# Patient Record
Sex: Female | Born: 1965 | Race: White | Hispanic: No | Marital: Married | Smoking: Never smoker
Health system: Southern US, Community
[De-identification: ages and names within clinical notes are randomized; demographics above are authoritative.]

## PROBLEM LIST (undated history)

## (undated) DIAGNOSIS — D369 Benign neoplasm, unspecified site: Secondary | ICD-10-CM

## (undated) DIAGNOSIS — K621 Rectal polyp: Secondary | ICD-10-CM

## (undated) DIAGNOSIS — K645 Perianal venous thrombosis: Secondary | ICD-10-CM

## (undated) HISTORY — PX: OTHER SURGICAL HISTORY: SHX169

---

## 1997-10-01 ENCOUNTER — Ambulatory Visit (HOSPITAL_COMMUNITY): Admission: RE | Admit: 1997-10-01 | Discharge: 1997-10-01 | Payer: Self-pay | Admitting: Obstetrics and Gynecology

## 1997-10-03 ENCOUNTER — Inpatient Hospital Stay (HOSPITAL_COMMUNITY): Admission: AD | Admit: 1997-10-03 | Discharge: 1997-10-06 | Payer: Self-pay | Admitting: Obstetrics and Gynecology

## 1998-11-21 ENCOUNTER — Other Ambulatory Visit: Admission: RE | Admit: 1998-11-21 | Discharge: 1998-11-21 | Payer: Self-pay | Admitting: Obstetrics and Gynecology

## 2000-04-06 ENCOUNTER — Other Ambulatory Visit: Admission: RE | Admit: 2000-04-06 | Discharge: 2000-04-06 | Payer: Self-pay | Admitting: Obstetrics and Gynecology

## 2001-05-03 ENCOUNTER — Inpatient Hospital Stay (HOSPITAL_COMMUNITY): Admission: AD | Admit: 2001-05-03 | Discharge: 2001-05-05 | Payer: Self-pay | Admitting: *Deleted

## 2001-06-08 ENCOUNTER — Other Ambulatory Visit: Admission: RE | Admit: 2001-06-08 | Discharge: 2001-06-08 | Payer: Self-pay | Admitting: Obstetrics and Gynecology

## 2002-07-27 ENCOUNTER — Other Ambulatory Visit: Admission: RE | Admit: 2002-07-27 | Discharge: 2002-07-27 | Payer: Self-pay | Admitting: Obstetrics and Gynecology

## 2003-08-23 ENCOUNTER — Other Ambulatory Visit: Admission: RE | Admit: 2003-08-23 | Discharge: 2003-08-23 | Payer: Self-pay | Admitting: Obstetrics and Gynecology

## 2004-09-24 ENCOUNTER — Other Ambulatory Visit: Admission: RE | Admit: 2004-09-24 | Discharge: 2004-09-24 | Payer: Self-pay | Admitting: Obstetrics and Gynecology

## 2005-10-13 ENCOUNTER — Other Ambulatory Visit: Admission: RE | Admit: 2005-10-13 | Discharge: 2005-10-13 | Payer: Self-pay | Admitting: Obstetrics and Gynecology

## 2008-06-04 ENCOUNTER — Encounter: Admission: RE | Admit: 2008-06-04 | Discharge: 2008-06-04 | Payer: Self-pay | Admitting: Obstetrics and Gynecology

## 2009-12-14 IMAGING — US UNKNOWN US STUDY
1 series · 4 of 4 positions shown · non-contrast
Comparison: No previous exams.

CLINICAL DATA: Patient presents for additional views of the left
breast as a follow up to recent screening mammogram of 05/21/2008
demonstrating ill-defined density in the slightly outer left
retroareolar region.

DIGITAL DIAGNOSTIC  BILATERAL  MAMMOGRAM  WITH CAD AND LEFT BREAST
ULTRASOUND:

[Series 1: unknown us study · 4 of 4 slices shown]
[im 1/4]
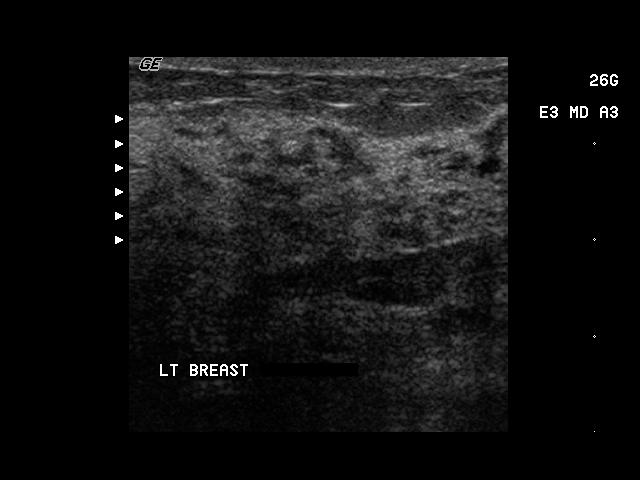
[im 2/4]
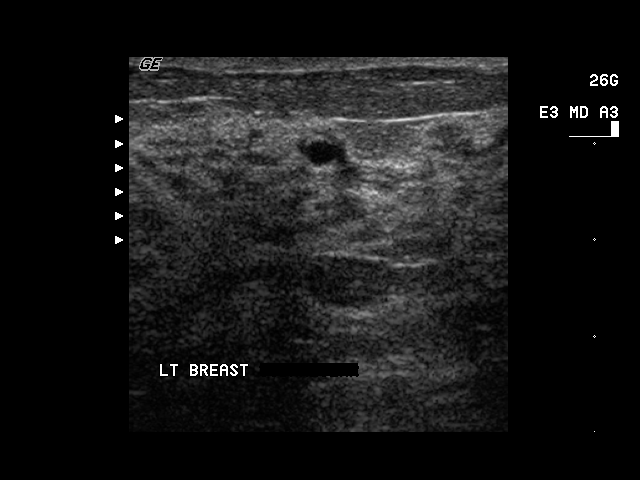
[im 3/4]
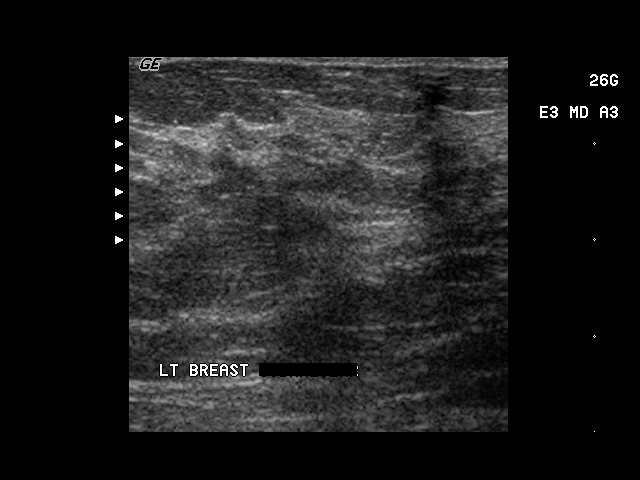
[im 4/4]
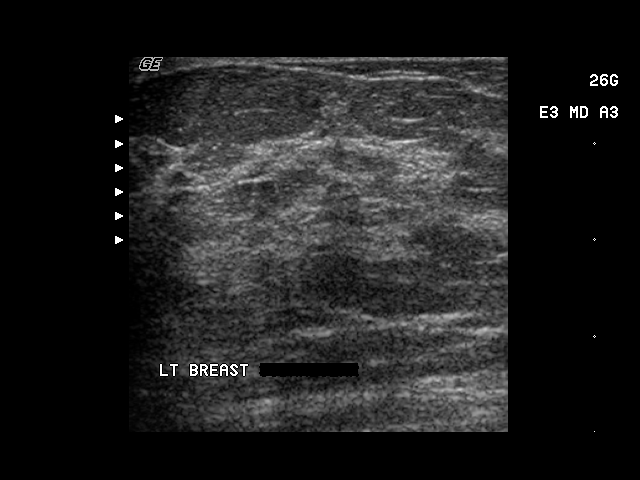

[4 of 4 positions shown; findings below may reference images not displayed]

FINDINGS: Exam demonstrates heterogeneous dense fibroglandular
tissue. Spot compression images again demonstrate the ill-defined
area of increased density in the left retroareolar region but no
well defined focal mass.

Ultrasound is performed, showing no focal abnormality in the left
retroareolar region to to account for the mammographic density as
this likely represents overlapping fibroglandular tissue.  A couple
tiny incidental cysts were noted in the retroareolar region.
IMPRESSION: No focal abnormality in the left retroareolar region.

Recommendations:  Recommend continued annual screening mammographic
followup.

BI-RADS CATEGORY 1:  Negative.

## 2015-11-11 ENCOUNTER — Other Ambulatory Visit: Payer: Self-pay | Admitting: Gastroenterology

## 2015-11-12 ENCOUNTER — Other Ambulatory Visit: Payer: Self-pay | Admitting: Gastroenterology

## 2015-11-12 NOTE — Progress Notes (Signed)
Voice message instructions left with contact [phone]- concerns or questions.

## 2015-11-13 ENCOUNTER — Ambulatory Visit (HOSPITAL_COMMUNITY): Payer: BLUE CROSS/BLUE SHIELD | Admitting: Anesthesiology

## 2015-11-13 ENCOUNTER — Ambulatory Visit (HOSPITAL_COMMUNITY)
Admission: RE | Admit: 2015-11-13 | Discharge: 2015-11-13 | Disposition: A | Discharge status: Home / Self Care | Payer: BLUE CROSS/BLUE SHIELD | Source: Ambulatory Visit | Attending: Gastroenterology | Admitting: Gastroenterology

## 2015-11-13 ENCOUNTER — Encounter (HOSPITAL_COMMUNITY): Payer: Self-pay

## 2015-11-13 ENCOUNTER — Encounter (HOSPITAL_COMMUNITY)
Admission: RE | Disposition: A | Discharge status: Home / Self Care | Payer: Self-pay | Source: Ambulatory Visit | Attending: Gastroenterology

## 2015-11-13 DIAGNOSIS — D128 Benign neoplasm of rectum: Secondary | ICD-10-CM | POA: Insufficient documentation

## 2015-11-13 DIAGNOSIS — Z1211 Encounter for screening for malignant neoplasm of colon: Secondary | ICD-10-CM | POA: Diagnosis present

## 2015-11-13 DIAGNOSIS — Z86010 Personal history of colonic polyps: Secondary | ICD-10-CM | POA: Diagnosis not present

## 2015-11-13 HISTORY — PX: COLONOSCOPY WITH PROPOFOL: SHX5780

## 2015-11-13 SURGERY — COLONOSCOPY WITH PROPOFOL
Anesthesia: Monitor Anesthesia Care

## 2015-11-13 MED ORDER — LACTATED RINGERS IV SOLN: INTRAVENOUS | Status: DC

## 2015-11-13 MED ORDER — PROPOFOL 10 MG/ML IV BOLUS
INTRAVENOUS | Status: AC
  Filled 2015-11-13: qty 20

## 2015-11-13 MED ORDER — FENTANYL CITRATE (PF) 100 MCG/2ML IJ SOLN: 25.0000 ug | INTRAMUSCULAR | Status: DC | PRN

## 2015-11-13 MED ORDER — LACTATED RINGERS IV SOLN
INTRAVENOUS | Status: DC | PRN
  Administered 2015-11-13: 09:00:00 via INTRAVENOUS

## 2015-11-13 MED ORDER — LIDOCAINE HCL (CARDIAC) 20 MG/ML IV SOLN
INTRAVENOUS | Status: AC
  Filled 2015-11-13: qty 5

## 2015-11-13 MED ORDER — SODIUM CHLORIDE 0.9 % IV SOLN: INTRAVENOUS | Status: DC

## 2015-11-13 MED ORDER — LACTATED RINGERS IV SOLN
INTRAVENOUS | Status: DC
  Administered 2015-11-13: 1000 mL via INTRAVENOUS

## 2015-11-13 MED ORDER — MEPERIDINE HCL 100 MG/ML IJ SOLN: 6.2500 mg | INTRAMUSCULAR | Status: DC | PRN

## 2015-11-13 MED ORDER — PROPOFOL 10 MG/ML IV BOLUS
INTRAVENOUS | Status: DC | PRN
  Administered 2015-11-13: 30 mg via INTRAVENOUS
  Administered 2015-11-13: 50 mg via INTRAVENOUS
  Administered 2015-11-13: 20 mg via INTRAVENOUS

## 2015-11-13 MED ORDER — LIDOCAINE HCL (CARDIAC) 20 MG/ML IV SOLN
INTRAVENOUS | Status: DC | PRN
  Administered 2015-11-13 (×2): 50 mg via INTRAVENOUS

## 2015-11-13 MED ORDER — PROPOFOL 10 MG/ML IV BOLUS
INTRAVENOUS | Status: AC
  Filled 2015-11-13: qty 40

## 2015-11-13 MED ORDER — PROPOFOL 500 MG/50ML IV EMUL
INTRAVENOUS | Status: DC | PRN
  Administered 2015-11-13: 125 ug/kg/min via INTRAVENOUS

## 2015-11-13 MED ORDER — METOCLOPRAMIDE HCL 5 MG/ML IJ SOLN: 10.0000 mg | Freq: Once | INTRAMUSCULAR | Status: DC | PRN

## 2015-11-13 SURGICAL SUPPLY — 21 items

## 2015-11-13 NOTE — Anesthesia Preprocedure Evaluation (Addendum)
Anesthesia Evaluation  Patient identified by MRN, date of birth, ID band Patient awake    Reviewed: Allergy & Precautions, NPO status , Patient's Chart, lab work & pertinent test results  Airway Mallampati: II  TM Distance: >3 FB Neck ROM: Full    Dental no notable dental hx.    Pulmonary neg pulmonary ROS,    Pulmonary exam normal breath sounds clear to auscultation       Cardiovascular negative cardio ROS Normal cardiovascular exam Rhythm:Regular Rate:Normal     Neuro/Psych negative neurological ROS  negative psych ROS   GI/Hepatic negative GI ROS, Neg liver ROS,   Endo/Other  negative endocrine ROS  Renal/GU negative Renal ROS  negative genitourinary   Musculoskeletal negative musculoskeletal ROS (+)   Abdominal   Peds negative pediatric ROS (+)  Hematology negative hematology ROS (+)   Anesthesia Other Findings   Reproductive/Obstetrics negative OB ROS                             Anesthesia Physical Anesthesia Plan  ASA: I  Anesthesia Plan: MAC   Post-op Pain Management:    Induction: Intravenous  Airway Management Planned: Simple Face Mask  Additional Equipment:   Intra-op Plan:   Post-operative Plan: Extubation in OR  Informed Consent: I have reviewed the patients History and Physical, chart, labs and discussed the procedure including the risks, benefits and alternatives for the proposed anesthesia with the patient or authorized representative who has indicated his/her understanding and acceptance.   Dental advisory given  Plan Discussed with: CRNA  Anesthesia Plan Comments:        Anesthesia Quick Evaluation

## 2015-11-13 NOTE — Transfer of Care (Signed)
Immediate Anesthesia Transfer of Care Note  Patient: Joanne Ballard  Procedure(s) Performed: Procedure(s): COLONOSCOPY WITH PROPOFOL (N/A)  Patient Location: PACU  Anesthesia Type:MAC  Level of Consciousness: awake, alert  and oriented  Airway & Oxygen Therapy: Patient Spontanous Breathing and Patient connected to face mask oxygen  Post-op Assessment: Report given to RN and Post -op Vital signs reviewed and stable  Post vital signs: Reviewed and stable  Last Vitals:  Filed Vitals:   11/13/15 0902  BP: 112/59  Pulse: 63  Temp: 36.7 C  Resp: 16    Last Pain: There were no vitals filed for this visit.       Complications: No apparent anesthesia complications

## 2015-11-13 NOTE — Op Note (Signed)
[facility] Patient Name: Joanne Ballard Procedure Date: 11/13/2015 MRN: [id number] Attending MD: Arta Silence , MD Date of Birth: [birth year] CSN: [id number] Age: 50 Admit Type: Outpatient Procedure:                Colonoscopy Indications:              High risk colon cancer surveillance: Personal                            history of colonic polyps, High risk colon cancer                            surveillance: Personal history of adenoma (10 mm or                            greater in size), High risk colon cancer                            surveillance: Personal history of adenoma with                            villous component, High risk colon cancer                            surveillance: Personal history of sessile serrated                            colon polyp (less than 10 mm in size) with no                            dysplasia Providers:                Arta Silence, MD, Dustin Flock, RN, Alfonso Patten,                            Technician, [facility], CRNA Referring MD:             Arvella Nigh, MD Medicines:                Propofol per Anesthesia Complications:            No immediate complications. Estimated Blood Loss:     Estimated blood loss was minimal. Procedure:                Pre-Anesthesia Assessment:                           - Prior to the procedure, a History and Physical                            was performed, and patient medications and                            allergies were reviewed. The patient's tolerance of  previous anesthesia was also reviewed. The risks                            and benefits of the procedure and the sedation                            options and risks were discussed with the patient.                            All questions were answered, and informed consent                            was obtained. Prior Anticoagulants: The patient has                            taken no  previous anticoagulant or antiplatelet                            agents. ASA Grade Assessment: I - A normal, healthy                            patient. After reviewing the risks and benefits,                            the patient was deemed in satisfactory condition to                            undergo the procedure.                           After obtaining informed consent, the colonoscope                            was passed under direct vision. Throughout the                            procedure, the patient's blood pressure, pulse, and                            oxygen saturations were monitored continuously. The                            Colonoscope was introduced through the anus and                            advanced to the the cecum, identified by                            appendiceal orifice and ileocecal valve. The                            ileocecal valve, appendiceal orifice, and rectum  were photographed. The entire colon was examined.                            The colonoscopy was performed without difficulty.                            The patient tolerated the procedure well. The                            quality of the bowel preparation was good. Scope In: 9:24:10 AM Scope Out: 9:51:38 AM Scope Withdrawal Time: 0 hours 21 minutes 34 seconds  Total Procedure Duration: 0 hours 27 minutes 28 seconds  Findings:      The perianal and digital rectal examinations were normal.      A 8 mm region of residual polyp was found in the rectum, at location of       prior large rectal polypectomy site. The polyp was sessile. The polyp       was removed with a hot snare. Resection and retrieval were complete.       Small bloody oozing was noted after polypectomy. For hemostasis, two       hemostatic clips were successfully placed. There was no bleeding at the       end of the procedure.      Colon otherwise normal; no other polyps, masses, vascular  ectasias, or       inflammatory changes were seen. Region in ascending colon, with       "nodularity" previously noted, was evaluated and no obvious polyp tissue       was identified.      No additional abnormalities were found on retroflexion. Impression:               - One 8 mm polyp in the rectum, removed with a hot                            snare. Resected and retrieved. Clips were placed.                           - The examination was otherwise normal. Moderate Sedation:      None Recommendation:           - Patient has a contact number available for                            emergencies. The signs and symptoms of potential                            delayed complications were discussed with the                            patient. Return to normal activities tomorrow.                            Written discharge instructions were provided to the                            patient.                           -  Discharge patient to home (via wheelchair).                           - Resume previous diet today.                           - No aspirin, ibuprofen, naproxen, or other                            non-steroidal anti-inflammatory drugs for 5 days                            after polyp removal.                           - Await pathology results.                           - Repeat colonoscopy at appointment to be scheduled                            for surveillance based on pathology results. Given                            residual polyp tissue despite prior endoscopic                            appearance in September 2016 of complete                            polypectomy, might repeat sigmoidoscopy in another                            6 months to reassess rectal polypectomy site again.                           - Return to GI office PRN.                           - Return to referring physician as previously                            scheduled. Procedure Code(s):         --- Professional ---                           [phone], Colonoscopy, flexible; with removal of                            tumor(s), polyp(s), or other lesion(s) by snare                            technique Diagnosis Code(s):        --- Professional ---  K62.1, Rectal polyp                           Z86.010, Personal history of colonic polyps CPT copyright 2016 [employer]. All rights reserved. The codes documented in this report are preliminary and upon coder review may  be revised to meet current compliance requirements. Arta Silence, MD 11/13/2015 10:04:26 AM This report has been signed electronically. Number of Addenda: 0

## 2015-11-13 NOTE — Discharge Instructions (Signed)
Colonoscopy, Care After Refer to this sheet in the next few weeks. These instructions provide you with information on caring for yourself after your procedure. Your health care provider may also give you more specific instructions. Your treatment has been planned according to current medical practices, but problems sometimes occur. Call your health care provider if you have any problems or questions after your procedure. WHAT TO EXPECT AFTER THE PROCEDURE  After your procedure, it is typical to have the following:  A small amount of blood in your stool.  Moderate amounts of gas and mild abdominal cramping or bloating. HOME CARE INSTRUCTIONS  Do not drive, operate machinery, or sign important documents for 24 hours.  You may shower and resume your regular physical activities, but move at a slower pace for the first 24 hours.  Take frequent rest periods for the first 24 hours.  Walk around or put a warm pack on your abdomen to help reduce abdominal cramping and bloating.  Drink enough fluids to keep your urine clear or pale yellow.  You may resume your normal diet as instructed by your health care provider. Avoid heavy or fried foods that are hard to digest.  Avoid drinking alcohol for 24 hours or as instructed by your health care provider.  Only take over-the-counter or prescription medicines as directed by your health care provider.  If a tissue sample (biopsy) was taken during your procedure:  Do not take aspirin, antiinflammatory medications (NSAIDs), or blood thinners for 7 days, or as instructed by your health care provider.  Do not drink alcohol for 7 days, or as instructed by your health care provider.  Eat soft foods for the first 24 hours. SEEK MEDICAL CARE IF: You have persistent spotting of blood in your stool 2-3 days after the procedure. SEEK IMMEDIATE MEDICAL CARE IF:  You have more than a small spotting of blood in your stool.  You pass large blood clots in your  stool.  Your abdomen is swollen (distended).  You have nausea or vomiting.  You have a fever.  You have increasing abdominal pain that is not relieved with medicine.   This information is not intended to replace advice given to you by your health care provider. Make sure you discuss any questions you have with your health care provider.   Document Released: 02/04/2004 Document Revised: 04/12/2013 Document Reviewed: 02/27/2013 Elsevier Interactive Patient Education [employer].

## 2015-11-13 NOTE — Anesthesia Postprocedure Evaluation (Signed)
Anesthesia Post Note  Patient: Joanne Ballard  Procedure(s) Performed: Procedure(s) (LRB): COLONOSCOPY WITH PROPOFOL (N/A)  Patient location during evaluation: Endoscopy Anesthesia Type: MAC Level of consciousness: awake and alert Pain management: pain level controlled Vital Signs Assessment: post-procedure vital signs reviewed and stable Respiratory status: spontaneous breathing, nonlabored ventilation, respiratory function stable and patient connected to nasal cannula oxygen Cardiovascular status: stable and blood pressure returned to baseline Anesthetic complications: no    Last Vitals:  Filed Vitals:   11/13/15 1020 11/13/15 1026  BP: 114/65 121/76  Pulse: 57 57  Temp:    Resp: 15 14    Last Pain: There were no vitals filed for this visit.               Montez Hageman

## 2015-11-13 NOTE — H&P (Signed)
Patient interval history reviewed.  Patient examined again.  There has been no change from documented H/P dated 10/28/15 (scanned into chart from our office) except as documented above.  Assessment:  1.  Personal history multiple large colon polyps.  Plan:  1.  Colonoscopy with possible polypectomy, possible biopsy, possible APC therapy. 2.  Risks (bleeding, infection, bowel perforation that could require surgery, sedation-related changes in cardiopulmonary systems), benefits (identification and possible treatment of source of symptoms, exclusion of certain causes of symptoms), and alternatives (watchful waiting, radiographic imaging studies, empiric medical treatment) of colonoscopy were explained to patient/family in detail and patient wishes to proceed.

## 2015-11-14 ENCOUNTER — Encounter (HOSPITAL_COMMUNITY): Payer: Self-pay | Admitting: Gastroenterology

## 2016-07-27 ENCOUNTER — Ambulatory Visit: Payer: Self-pay | Admitting: Surgery

## 2016-07-27 NOTE — H&P (Signed)
Joanne Ballard 07/27/2016 11:18 AM Location: Joanne [facility]ard Patient #: Joanne Ballard DOB: 1965-09-27 Married / Language: Joanne Ballard / Race: White Female  History of Present Illness Joanne Hector MD; 07/27/2016 12:00 PM) The patient is a 51 year old female who presents with a colorectal polyp. Note for "Colorectal polyp": Patient sent for surgical consultation at the request of her gastroenterologist, Dr. well Ballard. 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  Pleasant active female. Colon polyps. Had adenomatous ones removed. Has been one in the distal rectum that has been removed from it keeps recurring. Probably was a bulky 1 and 2016. Had colonoscopy in May and residual polyp removed consistent with adenomatous polyp. Can scoped in January. Again small polyp noted. He is adenomatous. Request for more definitive removal.  Condition usually moves her bowels once a day. His had some mild hemorrhoid flares once or twice year but nothing too severe. No major bleeding. Great-grandmother died of colon cancer. History of polyps. She had a C-section in 1999 but no other surgeries. She comes today with her husband. Smoking. As a few drinks alcohol week total. No personal nor family history of inflammatory bowel disease, irritable bowel syndrome, allergy such as Celiac Sprue, dietary/dairy problems, colitis, ulcers nor gastritis. No recent sick contacts/gastroenteritis. No travel outside the country. No changes in diet. No dysphagia to solids or liquids. No significant heartburn or reflux. No hematochezia, hematemesis, coffee ground emesis. No evidence of prior gastric/peptic ulceration.   Past Surgical History Joanne Ballard, [place]; 07/27/2016 11:18 AM) Cesarean Section - 1 Colon Polyp Removal - Colonoscopy  Diagnostic Studies History Joanne Ballard, [place]; 07/27/2016 11:18 AM) Colonoscopy within last year Mammogram within last year Pap Smear 1-5 years  ago  Allergies Joanne Ballard, CMA; 07/27/2016 11:19 AM) Erythrocin Stearate *MACROLIDES* Stomach Cramps Allergies Reconciled  Medication History Joanne Ballard, CMA; 07/27/2016 11:19 AM) No Current Medications Medications Reconciled  Social History Joanne Ballard, [place]; 07/27/2016 11:18 AM) Alcohol use Occasional alcohol use. Caffeine use Carbonated beverages, Tea. No drug use Tobacco use Never smoker.  Family History Joanne Ballard, [place]; 07/27/2016 11:18 AM) Cancer Father. Colon Polyps Mother. Hypertension Father, Mother.  Pregnancy / Birth History Joanne Ballard, [place]; 07/27/2016 11:18 AM) Age at menarche 65 years. Contraceptive History Intrauterine device, Oral contraceptives. Gravida 2 Irregular periods Maternal age 13-30 Para 2  Other Problems Joanne Ballard, [place]; 07/27/2016 11:18 AM) Hemorrhoids     Review of Systems (Joanne Ballard; 07/27/2016 11:18 AM) General Not Present- Appetite Loss, Chills, Fatigue, Fever, Night Sweats, Weight Gain and Weight Loss. Skin Not Present- Change in Wart/Mole, Dryness, Hives, Jaundice, New Lesions, Non-Healing Wounds, Rash and Ulcer. HEENT Present- Wears glasses/contact lenses. Not Present- Earache, Hearing Loss, Hoarseness, Nose Bleed, Oral Ulcers, Ringing in the Ears, Seasonal Allergies, Sinus Pain, Sore Throat, Visual Disturbances and Yellow Eyes. Respiratory Not Present- Bloody sputum, Chronic Cough, Difficulty Breathing, Snoring and Wheezing. Breast Not Present- Breast Mass, Breast Pain, Nipple Discharge and Skin Changes. Cardiovascular Not Present- Chest Pain, Difficulty Breathing Lying Down, Leg Cramps, Palpitations, Rapid Heart Rate, Shortness of Breath and Swelling of Extremities. Gastrointestinal Present- Hemorrhoids. Not Present- Abdominal Pain, Bloating, Bloody Stool, Change in Bowel Habits, Chronic diarrhea, Constipation, Difficulty Swallowing, Excessive gas, Gets full quickly at meals, Indigestion, Nausea,  Rectal Pain and Vomiting. Female Genitourinary Not Present- Frequency, Nocturia, Painful Urination, Pelvic Pain and Urgency. Musculoskeletal Not Present- Back Pain, Joint Pain, Joint Stiffness, Muscle Pain, Muscle Weakness and Swelling of Extremities. Neurological Not Present- Decreased Memory, Fainting, Headaches, Numbness, Seizures, Tingling, Tremor,  Trouble walking and Weakness. Psychiatric Not Present- Anxiety, Bipolar, Change in Sleep Pattern, Depression, Fearful and Frequent crying. Endocrine Present- Hot flashes. Not Present- Cold Intolerance, Excessive Hunger, Hair Changes, Heat Intolerance and New Diabetes. Hematology Not Present- Blood Thinners, Easy Bruising, Excessive bleeding, Gland problems, HIV and Persistent Infections.  Vitals Joanne Ballard CMA; 07/27/2016 11:20 AM) 07/27/2016 11:19 AM Weight: 161.8 lb Height: 68in Body Surface Area: 1.87 m Body Mass Index: 24.6 kg/m  Temp.: 98.17F  Pulse: 100 (Regular)  BP: 120/72 (Sitting, Left Arm, Standard)      Physical Exam Joanne Hector MD; 07/27/2016 11:56 AM)  General Mental Status-Alert. General Appearance-Not in acute distress, Not Sickly. Orientation-Oriented X3. Hydration-Joanne hydrated. Voice-Normal.  Integumentary Global Assessment Upon inspection and palpation of skin surfaces of the - Axillae: non-tender, no inflammation or ulceration, no drainage. and Distribution of scalp and body hair is normal. General Characteristics Temperature - normal warmth is noted.  Head and Neck Head-normocephalic, atraumatic with no lesions or palpable masses. Face Global Assessment - atraumatic, no absence of expression. Neck Global Assessment - no abnormal movements, no bruit auscultated on the right, no bruit auscultated on the left, no decreased range of motion, non-tender. Trachea-midline. Thyroid Gland Characteristics - non-tender.  Eye Eyeball - Left-Extraocular movements intact, No  Nystagmus. Eyeball - Right-Extraocular movements intact, No Nystagmus. Cornea - Left-No Hazy. Cornea - Right-No Hazy. Sclera/Conjunctiva - Left-No scleral icterus, No Discharge. Sclera/Conjunctiva - Right-No scleral icterus, No Discharge. Pupil - Left-Direct reaction to light normal. Pupil - Right-Direct reaction to light normal.  ENMT Ears Pinna - Left - no drainage observed, no generalized tenderness observed. Right - no drainage observed, no generalized tenderness observed. Nose and Sinuses External Inspection of the Nose - no destructive lesion observed. Inspection of the nares - Left - quiet respiration. Right - quiet respiration. Mouth and Throat Lips - Upper Lip - no fissures observed, no pallor noted. Lower Lip - no fissures observed, no pallor noted. Nasopharynx - no discharge present. Oral Cavity/Oropharynx - Tongue - no dryness observed. Oral Mucosa - no cyanosis observed. Hypopharynx - no evidence of airway distress observed.  Chest and Lung Exam Inspection Movements - Normal and Symmetrical. Accessory muscles - No use of accessory muscles in breathing. Palpation Palpation of the chest reveals - Non-tender. Auscultation Breath sounds - Normal and Clear.  Cardiovascular Auscultation Rhythm - Regular. Murmurs & Other Heart Sounds - Auscultation of the heart reveals - No Murmurs and No Systolic Clicks.  Abdomen Inspection Inspection of the abdomen reveals - No Visible peristalsis and No Abnormal pulsations. Umbilicus - No Bleeding, No Urine drainage. Palpation/Percussion Palpation and Percussion of the abdomen reveal - Soft, Non Tender, No Rebound tenderness, No Rigidity (guarding) and No Cutaneous hyperesthesia. Note: Abdomen soft. Nontender, nondistended. No guarding. No diastasis. No umbilical nor other hernias  Female Genitourinary Sexual Maturity Tanner 5 - Adult hair pattern. Note: No vaginal bleeding nor discharge  Rectal Note: Right  posterior mobile 15 x 10 mm elevated scar about 3-4 centimeters from the anal verge. Right posterior internal hemorrhoid partially prolapsing. Rest of the hemorrhoid piles not particularly inflamed. No obvious scarring elsewhere.  Perianal skin clean with good hygiene. No pruritis ani. No pilonidal disease. No fissure. No abscess/fistula. Normal sphincter tone. No external hemorrhoids. No condyloma warts. Tolerates digital and anoscopic rectal exam. No other rectal masses. Exam done with assistance of female PAssistant in the room.  Peripheral Vascular Upper Extremity Inspection - Left - No Cyanotic nailbeds, Not Ischemic. Right - No  Cyanotic nailbeds, Not Ischemic.  Neurologic Neurologic evaluation reveals -normal attention span and ability to concentrate, able to name objects and repeat phrases. Appropriate fund of knowledge , normal sensation and normal coordination. Mental Status Affect - not angry, not paranoid. Cranial Nerves-Normal Bilaterally. Gait-Normal.  Neuropsychiatric Mental status exam performed with findings of-able to articulate Joanne with normal speech/language, rate, volume and coherence, thought content normal with ability to perform basic computations and apply abstract reasoning and no evidence of hallucinations, delusions, obsessions or homicidal/suicidal ideation.  Musculoskeletal Global Assessment Spine, Ribs and Pelvis - no instability, subluxation or laxity. Right Upper Extremity - no instability, subluxation or laxity.  Lymphatic Head & Neck  General Head & Neck Lymphatics: Bilateral - Description - No Localized lymphadenopathy. Axillary  General Axillary Region: Bilateral - Description - No Localized lymphadenopathy. Femoral & Inguinal  Generalized Femoral & Inguinal Lymphatics: Left - Description - No Localized lymphadenopathy. Right - Description - No Localized lymphadenopathy.    Assessment & Plan Joanne Hector MD; 07/27/2016 11:56  AM)  ADENOMATOUS RECTAL POLYP (D12.8) Impression: Recurrent adenomatous rectal polyp despite prior polypectomies. Appears to be right posterior midline about 3 cm from the anal verge.  I think she would benefit from resection. Would do TEM partial proctectomy to get good margins. She has to deal with a partially prolapsing hemorrhoid in the same time. Should be outpatient Ballard since its rather distal and posterior. The patient has been agree to proceed. I believe they have some vacation trip in March, so there hoping to get it done in early February of possible. Could not guarantee scheduling but we will see.  Current Plans You are being scheduled for Ballard- Our schedulers will call you.  You should hear from our office's scheduling department within 5 working days about the location, date, and time of Ballard. We try to make accommodations for patient's preferences in scheduling Ballard, but sometimes the OR schedule or the surgeon's schedule prevents [place] from making those accommodations.  If you have not heard from our office [phone]) in 5 working days, call the office and ask for your surgeon's nurse.  If you have other questions about your diagnosis, plan, or Ballard, call the office and ask for your surgeon's nurse.  Written instructions provided Pt Education - [facility] TEM Education (Anvith Mauriello): discussed with patient and provided information. Consider follow up colonoscopy by your gastroenterologist. Since it was a benign pre-cancerous polyp that was removed by colon resection, consider follow-up colonoscopy in about 3 years. Call your gastroenterologist for advice  PROLAPSED INTERNAL HEMORRHOIDS, GRADE 3 (K64.2) Impression: Right posterior partially prolapsing hemorrhoid. Not horrifically symptomatic. May do hemorrhoidal ligation at that time. Most likely will improve after doing the TEM resection of the right posterior rectal wall mass anyway.  Current Plans Pt Education - [facility]  Hemorrhoids (Janeka Libman): discussed with patient and provided information. ENCOUNTER FOR PREOPERATIVE EXAMINATION FOR GENERAL SURGICAL PROCEDURE (Z01.818)  Current Plans You are being scheduled for Ballard- Our schedulers will call you.  You should hear from our office's scheduling department within 5 working days about the location, date, and time of Ballard. We try to make accommodations for patient's preferences in scheduling Ballard, but sometimes the OR schedule or the surgeon's schedule prevents [place] from making those accommodations.  If you have not heard from our office [phone]) in 5 working days, call the office and ask for your surgeon's nurse.  If you have other questions about your diagnosis, plan, or Ballard, call the office and ask for  your surgeon's nurse.  Pt Education - [facility] Rectal Prep for Anorectal outpatient/office Ballard: discussed with patient and provided information. Pt Education - [facility] Rectal [facility] HCI (Aleyah Balik): discussed with patient and provided information.  Joanne Ballard, M.D., F.A.C.S. [facility] [address], [place], [place] [address] [phone] Main / Paging

## 2016-08-10 NOTE — Patient Instructions (Addendum)
Joanne Ballard  08/10/2016   Your procedure is scheduled on: 08-20-16  Report to [facility] Main  Entrance take [facility]  elevators to 3rd floor to  [place] at 1200 PM  Call this number if you have problems the morning of surgery [phone]   Remember: ONLY 1 PERSON MAY GO WITH YOU TO SHORT STAY TO GET  READY MORNING OF YOUR SURGERY.  Do not eat food s :After Midnight TUESDAY NIGHT FOLLOW ALL BOWEL PREP INSTRUCTIONS FROM DR GROSS AND DRINK PLENTY OF CLEAR LIQUIDS TO PREVENT DEHYDRATION WITH BOWEL PREP. MAY HAVE CLEAR LIQUIDS UNTIL 900 AM DAY OF SURGERY-THEN NOTHING BY MOUTH AFTER 900 AM DAY OF SURGERY.     Take these medicines the morning of surgery with A SIP OF WATER: NONE                               You may not have any metal on your body including hair pins and              piercings  Do not wear jewelry, make-up, lotions, powders or perfumes, deodorant             Do not wear nail polish.  Do not shave  48 hours prior to surgery.              Men may shave face and neck.   Do not bring valuables to the hospital. [place].  Contacts, dentures or bridgework may not be worn into surgery.  Leave suitcase in the car. After surgery it may be brought to your room.     Patients discharged the day of surgery will not be allowed to drive home.  Name and phone number of your driver:brad spouse cell [phone]  Special Instructions: N/A              Please read over the following fact sheets you were given: _____________________________________________________________________                CLEAR LIQUID DIET   Foods Allowed                                                                     Foods Excluded  Coffee and tea, regular and decaf                             liquids that you cannot  Plain Jell-O in any flavor                                             see through such as: Fruit ices  (not with fruit pulp)  milk, soups, orange juice  Iced Popsicles                                    All solid food Carbonated beverages, regular and diet                                    Cranberry, grape and apple juices Sports drinks like Gatorade Lightly seasoned clear broth or consume(fat free) Sugar, honey syrup  Sample Menu Breakfast                                Lunch                                     Supper Cranberry juice                    Beef broth                            Chicken broth Jell-O                                     Grape juice                           Apple juice Coffee or tea                        Jell-O                                      Popsicle                                                Coffee or tea                        Coffee or tea  _____________________________________________________________________  [facility] - Preparing for Surgery Before surgery, you can play an important role.  Because skin is not sterile, your skin needs to be as free of germs as possible.  You can reduce the number of germs on your skin by washing with CHG (chlorahexidine gluconate) soap before surgery.  CHG is an antiseptic cleaner which kills germs and bonds with the skin to continue killing germs even after washing. Please DO NOT use if you have an allergy to CHG or antibacterial soaps.  If your skin becomes reddened/irritated stop using the CHG and inform your nurse when you arrive at Short Stay. Do not shave (including legs and underarms) for at least 48 hours prior to the first CHG shower.  You may shave your face/neck. Please follow these instructions carefully:  1.  Shower with CHG Soap the night before surgery and the  morning of Surgery.  2.  If you choose to wash  your hair, wash your hair first as usual with your  normal  shampoo.  3.  After you shampoo, rinse your hair and body thoroughly to remove the  shampoo.                            4.  Use CHG as you would any other liquid soap.  You can apply chg directly  to the skin and wash                       Gently with a scrungie or clean washcloth.  5.  Apply the CHG Soap to your body ONLY FROM THE NECK DOWN.   Do not use on face/ open                           Wound or open sores. Avoid contact with eyes, ears mouth and genitals (private parts).                       Wash face,  Genitals (private parts) with your normal soap.             6.  Wash thoroughly, paying special attention to the area where your surgery  will be performed.  7.  Thoroughly rinse your body with warm water from the neck down.  8.  DO NOT shower/wash with your normal soap after using and rinsing off  the CHG Soap.                9.  Pat yourself dry with a clean towel.            10.  Wear clean pajamas.            11.  Place clean sheets on your bed the night of your first shower and do not  sleep with pets. Day of Surgery : Do not apply any lotions/deodorants the morning of surgery.  Please wear clean clothes to the hospital/surgery center.  FAILURE TO FOLLOW THESE INSTRUCTIONS MAY RESULT IN THE CANCELLATION OF YOUR SURGERY PATIENT SIGNATURE_________________________________  NURSE SIGNATURE__________________________________  ________________________________________________________________________

## 2016-08-11 ENCOUNTER — Encounter (HOSPITAL_COMMUNITY): Payer: Self-pay | Admitting: *Deleted

## 2016-08-11 ENCOUNTER — Encounter (HOSPITAL_COMMUNITY)
Admission: RE | Admit: 2016-08-11 | Discharge: 2016-08-11 | Disposition: A | Discharge status: Home / Self Care | Payer: BLUE CROSS/BLUE SHIELD | Source: Ambulatory Visit | Attending: Surgery | Admitting: Surgery

## 2016-08-11 DIAGNOSIS — D128 Benign neoplasm of rectum: Secondary | ICD-10-CM | POA: Insufficient documentation

## 2016-08-11 DIAGNOSIS — Z01812 Encounter for preprocedural laboratory examination: Secondary | ICD-10-CM | POA: Insufficient documentation

## 2016-08-11 HISTORY — DX: Benign neoplasm, unspecified site: D36.9

## 2016-08-11 LAB — CBC
HCT: 37.4 % (ref 36.0–46.0)
HEMOGLOBIN: 12.6 g/dL (ref 12.0–15.0)
MCH: 29.2 pg (ref 26.0–34.0)
MCHC: 33.7 g/dL (ref 30.0–36.0)
MCV: 86.8 fL (ref 78.0–100.0)
Platelets: 248 10*3/uL (ref 150–400)
RBC: 4.31 MIL/uL (ref 3.87–5.11)
RDW: 12.8 % (ref 11.5–15.5)
WBC: 4.7 10*3/uL (ref 4.0–10.5)

## 2016-08-11 LAB — HCG, SERUM, QUALITATIVE: PREG SERUM: NEGATIVE

## 2016-08-12 LAB — HEMOGLOBIN A1C
Hgb A1c MFr Bld: 5.1 % (ref 4.8–5.6)
Mean Plasma Glucose: 100 mg/dL

## 2016-08-20 ENCOUNTER — Ambulatory Visit (HOSPITAL_COMMUNITY): Payer: BLUE CROSS/BLUE SHIELD | Admitting: Anesthesiology

## 2016-08-20 ENCOUNTER — Ambulatory Visit (HOSPITAL_COMMUNITY)
Admission: RE | Admit: 2016-08-20 | Discharge: 2016-08-20 | Disposition: A | Discharge status: Home / Self Care | Payer: BLUE CROSS/BLUE SHIELD | Source: Ambulatory Visit | Attending: Surgery | Admitting: Surgery

## 2016-08-20 ENCOUNTER — Encounter (HOSPITAL_COMMUNITY)
Admission: RE | Disposition: A | Discharge status: Home / Self Care | Payer: Self-pay | Source: Ambulatory Visit | Attending: Surgery

## 2016-08-20 ENCOUNTER — Encounter (HOSPITAL_COMMUNITY): Payer: Self-pay | Admitting: *Deleted

## 2016-08-20 DIAGNOSIS — K645 Perianal venous thrombosis: Secondary | ICD-10-CM | POA: Diagnosis not present

## 2016-08-20 DIAGNOSIS — Z86010 Personal history of colonic polyps: Secondary | ICD-10-CM | POA: Insufficient documentation

## 2016-08-20 DIAGNOSIS — D128 Benign neoplasm of rectum: Secondary | ICD-10-CM | POA: Diagnosis present

## 2016-08-20 DIAGNOSIS — Z881 Allergy status to other antibiotic agents status: Secondary | ICD-10-CM | POA: Diagnosis not present

## 2016-08-20 DIAGNOSIS — K621 Rectal polyp: Secondary | ICD-10-CM

## 2016-08-20 DIAGNOSIS — Z800 Family history of malignant neoplasm of digestive organs: Secondary | ICD-10-CM | POA: Diagnosis not present

## 2016-08-20 DIAGNOSIS — K648 Other hemorrhoids: Secondary | ICD-10-CM | POA: Insufficient documentation

## 2016-08-20 HISTORY — DX: Perianal venous thrombosis: K64.5

## 2016-08-20 HISTORY — PX: HEMORRHOID SURGERY: SHX153

## 2016-08-20 HISTORY — PX: PARTIAL PROCTECTOMY BY TEM: SHX6011

## 2016-08-20 HISTORY — DX: Rectal polyp: K62.1

## 2016-08-20 SURGERY — PARTIAL PROCTECTOMY BY TEM
Anesthesia: General | Site: Rectum

## 2016-08-20 MED ORDER — DEXAMETHASONE SODIUM PHOSPHATE 10 MG/ML IJ SOLN
INTRAMUSCULAR | Status: AC
  Filled 2016-08-20: qty 1

## 2016-08-20 MED ORDER — ONDANSETRON HCL 4 MG/2ML IJ SOLN
INTRAMUSCULAR | Status: AC
  Filled 2016-08-20: qty 2

## 2016-08-20 MED ORDER — LIDOCAINE 2% (20 MG/ML) 5 ML SYRINGE
INTRAMUSCULAR | Status: AC
  Filled 2016-08-20: qty 15

## 2016-08-20 MED ORDER — STERILE WATER FOR IRRIGATION IR SOLN
Status: DC | PRN
  Administered 2016-08-20: 1000 mL

## 2016-08-20 MED ORDER — SUGAMMADEX SODIUM 200 MG/2ML IV SOLN
INTRAVENOUS | Status: AC
  Filled 2016-08-20: qty 2

## 2016-08-20 MED ORDER — FENTANYL CITRATE (PF) 100 MCG/2ML IJ SOLN
INTRAMUSCULAR | Status: AC
  Filled 2016-08-20: qty 2

## 2016-08-20 MED ORDER — FENTANYL CITRATE (PF) 100 MCG/2ML IJ SOLN
INTRAMUSCULAR | Status: DC | PRN
  Administered 2016-08-20 (×2): 50 ug via INTRAVENOUS

## 2016-08-20 MED ORDER — PROPOFOL 10 MG/ML IV BOLUS
INTRAVENOUS | Status: DC | PRN
  Administered 2016-08-20: 150 mg via INTRAVENOUS

## 2016-08-20 MED ORDER — DIBUCAINE 1 % RE OINT
TOPICAL_OINTMENT | RECTAL | Status: DC | PRN
  Administered 2016-08-20: 1 via RECTAL

## 2016-08-20 MED ORDER — OXYCODONE HCL 5 MG PO TABS
5.0000 mg | ORAL_TABLET | ORAL | Status: DC | PRN
Start: 2016-08-20 — End: 2016-08-20

## 2016-08-20 MED ORDER — BUPIVACAINE HCL (PF) 0.25 % IJ SOLN
INTRAMUSCULAR | Status: AC
  Filled 2016-08-20: qty 30

## 2016-08-20 MED ORDER — PROPOFOL 10 MG/ML IV BOLUS
INTRAVENOUS | Status: AC
  Filled 2016-08-20: qty 20

## 2016-08-20 MED ORDER — MIDAZOLAM HCL 2 MG/2ML IJ SOLN
INTRAMUSCULAR | Status: AC
  Filled 2016-08-20: qty 2

## 2016-08-20 MED ORDER — LIDOCAINE 2% (20 MG/ML) 5 ML SYRINGE
INTRAMUSCULAR | Status: DC | PRN
  Administered 2016-08-20: 40 mg via INTRAVENOUS

## 2016-08-20 MED ORDER — CELECOXIB 200 MG PO CAPS
400.0000 mg | ORAL_CAPSULE | ORAL | Status: AC
  Administered 2016-08-20: 400 mg via ORAL
  Filled 2016-08-20: qty 2

## 2016-08-20 MED ORDER — DEXAMETHASONE SODIUM PHOSPHATE 10 MG/ML IJ SOLN
INTRAMUSCULAR | Status: DC | PRN
  Administered 2016-08-20: 10 mg via INTRAVENOUS

## 2016-08-20 MED ORDER — 0.9 % SODIUM CHLORIDE (POUR BTL) OPTIME
TOPICAL | Status: DC | PRN
  Administered 2016-08-20: 1000 mL

## 2016-08-20 MED ORDER — ROCURONIUM BROMIDE 50 MG/5ML IV SOSY
PREFILLED_SYRINGE | INTRAVENOUS | Status: AC
  Filled 2016-08-20: qty 5

## 2016-08-20 MED ORDER — LIDOCAINE 2% (20 MG/ML) 5 ML SYRINGE
INTRAMUSCULAR | Status: DC | PRN
  Administered 2016-08-20: 1.5 mg/kg/h via INTRAVENOUS

## 2016-08-20 MED ORDER — ROCURONIUM BROMIDE 50 MG/5ML IV SOSY
PREFILLED_SYRINGE | INTRAVENOUS | Status: DC | PRN
  Administered 2016-08-20: 40 mg via INTRAVENOUS
  Administered 2016-08-20: 10 mg via INTRAVENOUS

## 2016-08-20 MED ORDER — GABAPENTIN 300 MG PO CAPS
300.0000 mg | ORAL_CAPSULE | ORAL | Status: AC
  Administered 2016-08-20: 300 mg via ORAL
  Filled 2016-08-20: qty 1

## 2016-08-20 MED ORDER — ACETAMINOPHEN 500 MG PO TABS
1000.0000 mg | ORAL_TABLET | ORAL | Status: AC
  Administered 2016-08-20: 1000 mg via ORAL
  Filled 2016-08-20: qty 2

## 2016-08-20 MED ORDER — LACTATED RINGERS IV SOLN
INTRAVENOUS | Status: DC
  Administered 2016-08-20: 13:00:00 via INTRAVENOUS

## 2016-08-20 MED ORDER — SODIUM CHLORIDE 0.9 % IJ SOLN
INTRAMUSCULAR | Status: AC
Start: 2016-08-20 — End: ?
  Filled 2016-08-20: qty 50

## 2016-08-20 MED ORDER — LACTATED RINGERS IR SOLN
Status: DC | PRN
  Administered 2016-08-20: 1000 mL

## 2016-08-20 MED ORDER — DIBUCAINE 1 % RE OINT
TOPICAL_OINTMENT | RECTAL | Status: AC
  Filled 2016-08-20: qty 28

## 2016-08-20 MED ORDER — BUPIVACAINE LIPOSOME 1.3 % IJ SUSP
INTRAMUSCULAR | Status: DC | PRN
  Administered 2016-08-20: 20 mL

## 2016-08-20 MED ORDER — HYDROMORPHONE HCL 1 MG/ML IJ SOLN
0.2500 mg | INTRAMUSCULAR | Status: DC | PRN
  Administered 2016-08-20 (×2): 0.5 mg via INTRAVENOUS

## 2016-08-20 MED ORDER — HYDROMORPHONE HCL 1 MG/ML IJ SOLN
INTRAMUSCULAR | Status: AC
  Filled 2016-08-20: qty 1

## 2016-08-20 MED ORDER — KETAMINE HCL 10 MG/ML IJ SOLN
INTRAMUSCULAR | Status: DC | PRN
  Administered 2016-08-20 (×4): 10 mg via INTRAVENOUS

## 2016-08-20 MED ORDER — BUPIVACAINE LIPOSOME 1.3 % IJ SUSP
20.0000 mL | INTRAMUSCULAR | Status: DC
  Filled 2016-08-20: qty 20

## 2016-08-20 MED ORDER — SUGAMMADEX SODIUM 200 MG/2ML IV SOLN
INTRAVENOUS | Status: DC | PRN
  Administered 2016-08-20: 200 mg via INTRAVENOUS

## 2016-08-20 MED ORDER — ONDANSETRON HCL 4 MG/2ML IJ SOLN
INTRAMUSCULAR | Status: DC | PRN
  Administered 2016-08-20: 4 mg via INTRAVENOUS

## 2016-08-20 MED ORDER — BUPIVACAINE HCL (PF) 0.25 % IJ SOLN
INTRAMUSCULAR | Status: DC | PRN
  Administered 2016-08-20: 20 mL

## 2016-08-20 MED ORDER — DEXTROSE 5 % IV SOLN
2.0000 g | INTRAVENOUS | Status: AC
  Administered 2016-08-20: 2 g via INTRAVENOUS
  Filled 2016-08-20: qty 2

## 2016-08-20 MED ORDER — MIDAZOLAM HCL 2 MG/2ML IJ SOLN
INTRAMUSCULAR | Status: DC | PRN
  Administered 2016-08-20: 2 mg via INTRAVENOUS

## 2016-08-20 MED ORDER — OXYCODONE HCL 5 MG PO TABS: 5.0000 mg | ORAL_TABLET | ORAL | 0 refills | Status: AC | PRN

## 2016-08-20 MED ORDER — OXYCODONE HCL 5 MG PO TABS: 10.0000 mg | ORAL_TABLET | ORAL | Status: DC | PRN

## 2016-08-20 MED ORDER — SUCCINYLCHOLINE CHLORIDE 200 MG/10ML IV SOSY
PREFILLED_SYRINGE | INTRAVENOUS | Status: AC
  Filled 2016-08-20: qty 10

## 2016-08-20 MED ORDER — CEFOTETAN DISODIUM-DEXTROSE 2-2.08 GM-% IV SOLR
INTRAVENOUS | Status: AC
  Filled 2016-08-20: qty 50

## 2016-08-20 SURGICAL SUPPLY — 51 items
BLADE SURG 15 STRL LF DISP TIS (BLADE) IMPLANT
BLADE SURG 15 STRL SS (BLADE)
BRIEF STRETCH FOR OB PAD LRG (UNDERPADS AND DIAPERS) ×5 IMPLANT
CABLE HIGH FREQUENCY MONO STRZ (ELECTRODE) ×5 IMPLANT
COVER SURGICAL LIGHT HANDLE (MISCELLANEOUS) ×5 IMPLANT
DRAPE LAPAROTOMY T 102X78X121 (DRAPES) IMPLANT
DRAPE WARM FLUID 44X44 (DRAPE) ×5 IMPLANT
DRSG PAD ABDOMINAL 8X10 ST (GAUZE/BANDAGES/DRESSINGS) ×5 IMPLANT
ELECT PENCIL ROCKER SW 15FT (MISCELLANEOUS) ×5 IMPLANT
ELECT REM PT RETURN 9FT ADLT (ELECTROSURGICAL) ×5
ELECTRODE REM PT RTRN 9FT ADLT (ELECTROSURGICAL) ×3 IMPLANT
GAUZE SPONGE 4X4 12PLY STRL (GAUZE/BANDAGES/DRESSINGS) ×5 IMPLANT
GAUZE SPONGE 4X4 16PLY XRAY LF (GAUZE/BANDAGES/DRESSINGS) ×5 IMPLANT
GLOVE ECLIPSE 8.0 STRL XLNG CF (GLOVE) ×10 IMPLANT
GLOVE INDICATOR 8.0 STRL GRN (GLOVE) ×10 IMPLANT
GOWN STRL REUS W/TWL XL LVL3 (GOWN DISPOSABLE) ×15 IMPLANT
KIT BASIN OR (CUSTOM PROCEDURE TRAY) ×5 IMPLANT
LEGGING LITHOTOMY PAIR STRL (DRAPES) ×5 IMPLANT
LUBRICANT JELLY K Y 4OZ (MISCELLANEOUS) ×5 IMPLANT
NEEDLE HYPO 22GX1.5 SAFETY (NEEDLE) ×5 IMPLANT
PACK BASIC VI WITH GOWN DISP (CUSTOM PROCEDURE TRAY) ×5 IMPLANT
PAD POSITIONING PINK XL (MISCELLANEOUS) ×5 IMPLANT
RETRACTOR LONE STAR DISPOSABLE (INSTRUMENTS) IMPLANT
RETRACTOR STAY HOOK 5MM (MISCELLANEOUS) IMPLANT
SCISSORS LAP 5X35 DISP (ENDOMECHANICALS) ×5 IMPLANT
SET IRRIG TUBING LAPAROSCOPIC (IRRIGATION / IRRIGATOR) ×10 IMPLANT
SHEARS HARMONIC ACE PLUS 36CM (ENDOMECHANICALS) ×5 IMPLANT
STOPCOCK 4 WAY LG BORE MALE ST (IV SETS) IMPLANT
SUT CHROMIC 2 0 SH (SUTURE) ×5 IMPLANT
SUT CHROMIC 3 0 SH 27 (SUTURE) IMPLANT
SUT PDS AB 2-0 CT2 27 (SUTURE) IMPLANT
SUT PDS AB 3-0 SH 27 (SUTURE) IMPLANT
SUT SILK 2 0 (SUTURE)
SUT SILK 2 0 SH CR/8 (SUTURE) IMPLANT
SUT SILK 2-0 18XBRD TIE 12 (SUTURE) IMPLANT
SUT SILK 3 0 SH 30 (SUTURE) IMPLANT
SUT SILK 3 0 SH CR/8 (SUTURE) IMPLANT
SUT V-LOC BARB 180 2/0GR6 GS22 (SUTURE)
SUT VIC AB 2-0 UR6 27 (SUTURE) ×15 IMPLANT
SUT VIC AB 3-0 SH 27 (SUTURE)
SUT VIC AB 3-0 SH 27XBRD (SUTURE) IMPLANT
SUTURE V-LC BRB 180 2/0GR6GS22 (SUTURE) IMPLANT
SYR 20CC LL (SYRINGE) ×5 IMPLANT
SYR BULB IRRIGATION 50ML (SYRINGE) IMPLANT
TOWEL OR 17X26 10 PK STRL BLUE (TOWEL DISPOSABLE) ×5 IMPLANT
TOWEL OR NON WOVEN STRL DISP B (DISPOSABLE) ×5 IMPLANT
TRAY FOLEY W/METER SILVER 16FR (SET/KITS/TRAYS/PACK) ×5 IMPLANT
TUBING CONNECTING 10 (TUBING) IMPLANT
TUBING CONNECTING 10' (TUBING)
TUBING INSUF HEATED (TUBING) ×5 IMPLANT
YANKAUER SUCT BULB TIP 10FT TU (MISCELLANEOUS) IMPLANT

## 2016-08-20 NOTE — Progress Notes (Signed)
Patient desires to get up to bathroom to void. Unable to void. Changed mesh briefs, abdominal pad, and 4x4 gauze as it is soiled with frank red blood. Patient tolerated ambulating well.

## 2016-08-20 NOTE — Anesthesia Preprocedure Evaluation (Addendum)
Anesthesia Evaluation  Patient identified by MRN, date of birth, ID band Patient awake    Reviewed: Allergy & Precautions, H&P , NPO status , Patient's Chart, lab work & pertinent test results  Airway Mallampati: II  TM Distance: >3 FB Neck ROM: Full    Dental no notable dental hx. (+) Teeth Intact, Dental Advisory Given   Pulmonary neg pulmonary ROS,    Pulmonary exam normal breath sounds clear to auscultation       Cardiovascular negative cardio ROS   Rhythm:Regular Rate:Normal     Neuro/Psych negative neurological ROS  negative psych ROS   GI/Hepatic negative GI ROS, Neg liver ROS,   Endo/Other  negative endocrine ROS  Renal/GU negative Renal ROS  negative genitourinary   Musculoskeletal   Abdominal   Peds  Hematology negative hematology ROS (+)   Anesthesia Other Findings   Reproductive/Obstetrics negative OB ROS                            Anesthesia Physical Anesthesia Plan  ASA: I  Anesthesia Plan: General   Post-op Pain Management:    Induction: Intravenous  Airway Management Planned: Oral ETT  Additional Equipment:   Intra-op Plan:   Post-operative Plan: Extubation in OR  Informed Consent: I have reviewed the patients History and Physical, chart, labs and discussed the procedure including the risks, benefits and alternatives for the proposed anesthesia with the patient or authorized representative who has indicated his/her understanding and acceptance.   Dental advisory given  Plan Discussed with: CRNA  Anesthesia Plan Comments:         Anesthesia Quick Evaluation

## 2016-08-20 NOTE — Interval H&P Note (Signed)
History and Physical Interval Note:  08/20/2016 1:20 PM  Joanne Ballard  has presented today for surgery, with the diagnosis of Recurrent rectal adenomatous polyp  The various methods of treatment have been discussed with the patient and family. After consideration of risks, benefits and other options for treatment, the patient has consented to  Procedure(s): PARTIAL PROCTECTOMY BY TEM WITH ERAS PATHWAY (N/A) EXAM UNDER ANESTHESIA (N/A) as a surgical intervention .  The patient's history has been reviewed, patient examined, no change in status, stable for surgery.  I have reviewed the patient's chart and labs.  Questions were answered to the patient's satisfaction.     [name]

## 2016-08-20 NOTE — Transfer of Care (Signed)
Immediate Anesthesia Transfer of Care Note  Patient: Joanne Ballard  Procedure(s) Performed: Procedure(s): PARTIAL PROCTECTOMY OF RECURRENT RECTAL POLYP BY TEM WITH ERAS PATHWAY (N/A) EXAM UNDER ANESTHESIA (N/A) HEMORRHOIDECTOMY  Patient Location: PACU  Anesthesia Type:General  Level of Consciousness: awake, alert , oriented and patient cooperative  Airway & Oxygen Therapy: Patient Spontanous Breathing and Patient connected to face mask oxygen  Post-op Assessment: Report given to RN and Post -op Vital signs reviewed and stable  Post vital signs: Reviewed and stable  Last Vitals:  Vitals:   08/20/16 1202  BP: 119/75  Pulse: 89  Resp: 16  Temp: 36.7 C    Last Pain:  Vitals:   08/20/16 1220  TempSrc:   PainSc: 0-No pain      Patients Stated Pain Goal: 3 ([id number])  Complications: No apparent anesthesia complications

## 2016-08-20 NOTE — Anesthesia Procedure Notes (Signed)
Procedure Name: Intubation Date/Time: 08/20/2016 1:50 PM Performed by: Dione Booze Pre-anesthesia Checklist: Patient identified, Emergency Drugs available, Suction available and Patient being monitored Patient Re-evaluated:Patient Re-evaluated prior to inductionOxygen Delivery Method: Circle system utilized Preoxygenation: Pre-oxygenation with 100% oxygen Intubation Type: IV induction Ventilation: Mask ventilation without difficulty Laryngoscope Size: Mac and 4 Grade View: Grade I Tube type: Oral Tube size: 7.5 mm Number of attempts: 1 Airway Equipment and Method: Stylet Placement Confirmation: ETT inserted through vocal cords under direct vision,  positive ETCO2 and breath sounds checked- equal and bilateral Secured at: 21 cm Tube secured with: Tape Dental Injury: Teeth and Oropharynx as per pre-operative assessment

## 2016-08-20 NOTE — Op Note (Signed)
08/20/2016  3:12 PM  PATIENT:  Joanne Ballard  51 y.o. female  Patient Care Team: Arvella Nigh, MD as PCP - General (Obstetrics and Gynecology) Michael Boston, MD as Consulting Physician (General Surgery) Arta Silence, MD as Consulting Physician (Gastroenterology)  PRE-OPERATIVE DIAGNOSIS:  Recurrent rectal adenomatous polyp  POST-OPERATIVE DIAGNOSIS:    Recurrent rectal adenomatous polyp Left posterior hemorrhoidectomy  PROCEDURE:    PARTIAL PROCTECTOMY OF RECURRENT RECTAL POLYP BY TEM  EXAM UNDER ANESTHESIA HEMORRHOIDECTOMY x1  SURGEON:  [name], MD  ASSISTANT: RN   ANESTHESIA:   local and general WITH ERAS PATHWAY  EBL:  Total I/O In: 700 [I.V.:700] Out: 100 [Urine:50; Blood:50]  Delay start of Pharmacological VTE agent (>24hrs) due to surgical blood loss or risk of bleeding:  no  DRAINS: none   SPECIMEN:  Source of Specimen:  Rectal polyp (see below) & left posterior internal hemorrhoid  DISPOSITION OF SPECIMEN:  PATHOLOGY  COUNTS:  YES  PLAN OF CARE: Admit for overnight observation  PATIENT DISPOSITION:  PACU - hemodynamically stable.  INDICATION: Pleasant wounds were recurrent polyps.  Has a recurrent distal rectal polyp despite polypectomies.  Concern for more invasive component.  Surgical request made for removal.  Patient also with some hemorrhoids as well.  Offered partial proctectomy of the recurrent rectal mass and possible hemorrhoidal ligation/removal.  The anatomy & physiology of the digestive tract was discussed.  The pathophysiology of the rectal pathology was discussed.  Natural history risks without surgery was discussed.   I feel the risks of no intervention will lead to serious problems that outweigh the operative risks; therefore, I recommended surgery.    Laparoscopic & open abdominal techniques were discussed.  I recommended we start with a partial proctectomy by transanal endoscopic microsurgery (TEM) for excisional biopsy to remove  the pathology and hopefully cure and/or control the pathology.  This technique can offer less operative risk and faster post-operative recovery.  Possible need for immediate or later abdominal surgery for further treatment was discussed.   Risks such as bleeding, abscess, reoperation, ostomy, heart attack, death, and other risks were discussed.   I noted a good likelihood this will help address the problem.  Goals of post-operative recovery were discussed as well.  We will work to minimize complications.  An educational handout was given as well.  Questions were answered.  The patient expresses understanding & wishes to proceed with surgery.  OR FINDINGS: Firm 15 mm pearly polypoid mobile mass on the posterior midline rectal wall about 3.5 cm proximal to the anal verge.  The resulting mass was 1.5 cm (Specimen 3.5x3.5 cm in size total)  Pin placement on pathology specimen: Proximal margin: purple Distal margin: blue Right lateral margin: white Left lateral margin: yellow  The closure rests 1 cm from the anal verge in the posterior location. Partially prolapsing left posteriolateral partially thrombosed hemorrhoid.  Excised and closed  DESCRIPTION: Informed consent was confirmed.  Patient received general anesthesia without difficulty.  Foley catheter sterilely placed.  Sequential compression devices active during the entire case.  The patient was placed in the lithotomy position, taking extra care to secure and protect the patient appropriately.  The perineum and perianal regions were prepped and draped in a sterile fashion.  Surgical timeout confirmed our plan.  I did a gentle digital rectal examination with gradual anal dilation to allow placement of the 4 cm TEO Stortz scope.  This was secured to the bed using the TEO clamping system.  We induced  carbon dioxide insufflation intraluminally.  I oriented the Stortz scope and the patient such that the specimen rested towards the floor at the 6:00  position.  I could easily identify the mass.  I went ahead and used tip point cautery to mark 1 cm margins circumferentially.  I then did a full thickness transection at the distal margin.  I came around laterally.  Switched over to harmonic dissection.  Lifted the specimen off the proximal sphincter complex and posterior pelvic canal for a good deep margin.  I gradually came more proximally.  Transected at the proximal margin.  I ensured hemostasis.  I inspected the main specimen and pinned it on thick cork board.  Pins as noted above.  I walked the specimen down to pathology and showed the [facility] pathology team for proper orientation.    I went back in and scrubbed in.  Hemostasis excellent.  I reapproximated the wound with a 2-0 vicryl horizontal mattress stitch to bring the middle part of the rectum down to help close the middle of the wound.  I then closed the wound transversely using 2-0 Vicryl stitch in a running fashion from each corner.  This brought things together well.  I did meticulous inspection with fine tip instruments to confirm good watertight closure   Hemostasis excellent.  The lumen was quite patent.    Patient still had a partially thrombosed partially prolapsed left posterior lateral hemorrhoid.  When an excised it primarily sparing anoderm and removing some clotted hemorrhoidal vessels.  A closed that wound with a running 2-0 chromic suture, leaving the last few millimeters open to for drainage.  And inspection.  No stenosis.  No bleeding.    The patient has been extubated & is in recovery room.  I discussed operative findings, updated the patient's status, discussed probable steps to recovery, and gave postoperative recommendations to the patient's spouse.  Recommendations were made.  Questions were answered.  He expressed understanding & appreciation.   [name], M.D., F.A.C.S. Gastrointestinal and Minimally Invasive Surgery [facility] [address] [place], [place] [address] [phone] Main / Paging

## 2016-08-20 NOTE — H&P (View-Only) (Signed)
[name] 07/27/2016 11:18 AM Location: [facility] Patient #: [name] DOB: 12-02-1965 Married / Language: Joanne Ballard / Race: White Female  History of Present Illness Joanne Hector MD; 07/27/2016 12:00 PM) The patient is a 51 year old female who presents with a colorectal polyp. Note for "Colorectal polyp": Patient sent for surgical consultation at the request of her gastroenterologist, Dr. well outlaw. 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  Pleasant active female. Colon polyps. Had adenomatous ones removed. Has been one in the distal rectum that has been removed from it keeps recurring. Probably was a bulky 1 and 2016. Had colonoscopy in May and residual polyp removed consistent with adenomatous polyp. Can scoped in January. Again small polyp noted. He is adenomatous. Request for more definitive removal.  Condition usually moves her bowels once a day. His had some mild hemorrhoid flares once or twice year but nothing too severe. No major bleeding. Great-grandmother died of colon cancer. History of polyps. She had a C-section in 1999 but no other surgeries. She comes today with her husband. Smoking. As a few drinks alcohol week total. No personal nor family history of inflammatory bowel disease, irritable bowel syndrome, allergy such as Celiac Sprue, dietary/dairy problems, colitis, ulcers nor gastritis. No recent sick contacts/gastroenteritis. No travel outside the country. No changes in diet. No dysphagia to solids or liquids. No significant heartburn or reflux. No hematochezia, hematemesis, coffee ground emesis. No evidence of prior gastric/peptic ulceration.   Past Surgical History Joanne Ballard, [place]; 07/27/2016 11:18 AM) Cesarean Section - 1 Colon Polyp Removal - Colonoscopy  Diagnostic Studies History Joanne Ballard, [place]; 07/27/2016 11:18 AM) Colonoscopy within last year Mammogram within last year Pap Smear 1-5 years  ago  Allergies Joanne Ballard, CMA; 07/27/2016 11:19 AM) Erythrocin Stearate *MACROLIDES* Stomach Cramps Allergies Reconciled  Medication History Joanne Ballard, CMA; 07/27/2016 11:19 AM) No Current Medications Medications Reconciled  Social History Joanne Ballard, [place]; 07/27/2016 11:18 AM) Alcohol use Occasional alcohol use. Caffeine use Carbonated beverages, Tea. No drug use Tobacco use Never smoker.  Family History Joanne Ballard, [place]; 07/27/2016 11:18 AM) Cancer Father. Colon Polyps Mother. Hypertension Father, Mother.  Pregnancy / Birth History Joanne Ballard, [place]; 07/27/2016 11:18 AM) Age at menarche 26 years. Contraceptive History Intrauterine device, Oral contraceptives. Gravida 2 Irregular periods Maternal age 24-30 Para 2  Other Problems Joanne Ballard, [place]; 07/27/2016 11:18 AM) Hemorrhoids     Review of Systems ([name]; 07/27/2016 11:18 AM) General Not Present- Appetite Loss, Chills, Fatigue, Fever, Night Sweats, Weight Gain and Weight Loss. Skin Not Present- Change in Wart/Mole, Dryness, Hives, Jaundice, New Lesions, Non-Healing Wounds, Rash and Ulcer. HEENT Present- Wears glasses/contact lenses. Not Present- Earache, Hearing Loss, Hoarseness, Nose Bleed, Oral Ulcers, Ringing in the Ears, Seasonal Allergies, Sinus Pain, Sore Throat, Visual Disturbances and Yellow Eyes. Respiratory Not Present- Bloody sputum, Chronic Cough, Difficulty Breathing, Snoring and Wheezing. Breast Not Present- Breast Mass, Breast Pain, Nipple Discharge and Skin Changes. Cardiovascular Not Present- Chest Pain, Difficulty Breathing Lying Down, Leg Cramps, Palpitations, Rapid Heart Rate, Shortness of Breath and Swelling of Extremities. Gastrointestinal Present- Hemorrhoids. Not Present- Abdominal Pain, Bloating, Bloody Stool, Change in Bowel Habits, Chronic diarrhea, Constipation, Difficulty Swallowing, Excessive gas, Gets full quickly at meals, Indigestion, Nausea,  Rectal Pain and Vomiting. Female Genitourinary Not Present- Frequency, Nocturia, Painful Urination, Pelvic Pain and Urgency. Musculoskeletal Not Present- Back Pain, Joint Pain, Joint Stiffness, Muscle Pain, Muscle Weakness and Swelling of Extremities. Neurological Not Present- Decreased Memory, Fainting, Headaches, Numbness, Seizures, Tingling, Tremor,  Trouble walking and Weakness. Psychiatric Not Present- Anxiety, Bipolar, Change in Sleep Pattern, Depression, Fearful and Frequent crying. Endocrine Present- Hot flashes. Not Present- Cold Intolerance, Excessive Hunger, Hair Changes, Heat Intolerance and New Diabetes. Hematology Not Present- Blood Thinners, Easy Bruising, Excessive bleeding, Gland problems, HIV and Persistent Infections.  Vitals Bary Castilla Joanne Ballard CMA; 07/27/2016 11:20 AM) 07/27/2016 11:19 AM Weight: 161.8 lb Height: 68in Body Surface Area: 1.87 m Body Mass Index: 24.6 kg/m  Temp.: 98.24F  Pulse: 100 (Regular)  BP: 120/72 (Sitting, Left Arm, Standard)      Physical Exam Joanne Hector MD; 07/27/2016 11:56 AM)  General Mental Status-Alert. General Appearance-Not in acute distress, Not Sickly. Orientation-Oriented X3. Hydration-Well hydrated. Voice-Normal.  Integumentary Global Assessment Upon inspection and palpation of skin surfaces of the - Axillae: non-tender, no inflammation or ulceration, no drainage. and Distribution of scalp and body hair is normal. General Characteristics Temperature - normal warmth is noted.  Head and Neck Head-normocephalic, atraumatic with no lesions or palpable masses. Face Global Assessment - atraumatic, no absence of expression. Neck Global Assessment - no abnormal movements, no bruit auscultated on the right, no bruit auscultated on the left, no decreased range of motion, non-tender. Trachea-midline. Thyroid Gland Characteristics - non-tender.  Eye Eyeball - Left-Extraocular movements intact, No  Nystagmus. Eyeball - Right-Extraocular movements intact, No Nystagmus. Cornea - Left-No Hazy. Cornea - Right-No Hazy. Sclera/Conjunctiva - Left-No scleral icterus, No Discharge. Sclera/Conjunctiva - Right-No scleral icterus, No Discharge. Pupil - Left-Direct reaction to light normal. Pupil - Right-Direct reaction to light normal.  ENMT Ears Pinna - Left - no drainage observed, no generalized tenderness observed. Right - no drainage observed, no generalized tenderness observed. Nose and Sinuses External Inspection of the Nose - no destructive lesion observed. Inspection of the nares - Left - quiet respiration. Right - quiet respiration. Mouth and Throat Lips - Upper Lip - no fissures observed, no pallor noted. Lower Lip - no fissures observed, no pallor noted. Nasopharynx - no discharge present. Oral Cavity/Oropharynx - Tongue - no dryness observed. Oral Mucosa - no cyanosis observed. Hypopharynx - no evidence of airway distress observed.  Chest and Lung Exam Inspection Movements - Normal and Symmetrical. Accessory muscles - No use of accessory muscles in breathing. Palpation Palpation of the chest reveals - Non-tender. Auscultation Breath sounds - Normal and Clear.  Cardiovascular Auscultation Rhythm - Regular. Murmurs & Other Heart Sounds - Auscultation of the heart reveals - No Murmurs and No Systolic Clicks.  Abdomen Inspection Inspection of the abdomen reveals - No Visible peristalsis and No Abnormal pulsations. Umbilicus - No Bleeding, No Urine drainage. Palpation/Percussion Palpation and Percussion of the abdomen reveal - Soft, Non Tender, No Rebound tenderness, No Rigidity (guarding) and No Cutaneous hyperesthesia. Note: Abdomen soft. Nontender, nondistended. No guarding. No diastasis. No umbilical nor other hernias  Female Genitourinary Sexual Maturity Tanner 5 - Adult hair pattern. Note: No vaginal bleeding nor discharge  Rectal Note: Right  posterior mobile 15 x 10 mm elevated scar about 3-4 centimeters from the anal verge. Right posterior internal hemorrhoid partially prolapsing. Rest of the hemorrhoid piles not particularly inflamed. No obvious scarring elsewhere.  Perianal skin clean with good hygiene. No pruritis ani. No pilonidal disease. No fissure. No abscess/fistula. Normal sphincter tone. No external hemorrhoids. No condyloma warts. Tolerates digital and anoscopic rectal exam. No other rectal masses. Exam done with assistance of female PAssistant in the room.  Peripheral Vascular Upper Extremity Inspection - Left - No Cyanotic nailbeds, Not Ischemic. Right - No  Cyanotic nailbeds, Not Ischemic.  Neurologic Neurologic evaluation reveals -normal attention span and ability to concentrate, able to name objects and repeat phrases. Appropriate fund of knowledge , normal sensation and normal coordination. Mental Status Affect - not angry, not paranoid. Cranial Nerves-Normal Bilaterally. Gait-Normal.  Neuropsychiatric Mental status exam performed with findings of-able to articulate well with normal speech/language, rate, volume and coherence, thought content normal with ability to perform basic computations and apply abstract reasoning and no evidence of hallucinations, delusions, obsessions or homicidal/suicidal ideation.  Musculoskeletal Global Assessment Spine, Ribs and Pelvis - no instability, subluxation or laxity. Right Upper Extremity - no instability, subluxation or laxity.  Lymphatic Head & Neck  General Head & Neck Lymphatics: Bilateral - Description - No Localized lymphadenopathy. Axillary  General Axillary Region: Bilateral - Description - No Localized lymphadenopathy. Femoral & Inguinal  Generalized Femoral & Inguinal Lymphatics: Left - Description - No Localized lymphadenopathy. Right - Description - No Localized lymphadenopathy.    Assessment & Plan Joanne Hector MD; 07/27/2016 11:56  AM)  ADENOMATOUS RECTAL POLYP (D12.8) Impression: Recurrent adenomatous rectal polyp despite prior polypectomies. Appears to be right posterior midline about 3 cm from the anal verge.  I think she would benefit from resection. Would do TEM partial proctectomy to get good margins. She has to deal with a partially prolapsing hemorrhoid in the same time. Should be outpatient surgery since its rather distal and posterior. The patient has been agree to proceed. I believe they have some vacation trip in March, so there hoping to get it done in early February of possible. Could not guarantee scheduling but we will see.  Current Plans You are being scheduled for surgery- Our schedulers will call you.  You should hear from our office's scheduling department within 5 working days about the location, date, and time of surgery. We try to make accommodations for patient's preferences in scheduling surgery, but sometimes the OR schedule or the surgeon's schedule prevents [place] from making those accommodations.  If you have not heard from our office [phone] in 5 working days, call the office and ask for your surgeon's nurse.  If you have other questions about your diagnosis, plan, or surgery, call the office and ask for your surgeon's nurse.  Written instructions provided Pt Education - [facility] TEM Education (Shanon Seawright): discussed with patient and provided information. Consider follow up colonoscopy by your gastroenterologist. Since it was a benign pre-cancerous polyp that was removed by colon resection, consider follow-up colonoscopy in about 3 years. Call your gastroenterologist for advice  PROLAPSED INTERNAL HEMORRHOIDS, GRADE 3 (K64.2) Impression: Right posterior partially prolapsing hemorrhoid. Not horrifically symptomatic. May do hemorrhoidal ligation at that time. Most likely will improve after doing the TEM resection of the right posterior rectal wall mass anyway.  Current Plans Pt Education - [facility]  Hemorrhoids (Caeson Filippi): discussed with patient and provided information. ENCOUNTER FOR PREOPERATIVE EXAMINATION FOR GENERAL SURGICAL PROCEDURE (Z01.818)  Current Plans You are being scheduled for surgery- Our schedulers will call you.  You should hear from our office's scheduling department within 5 working days about the location, date, and time of surgery. We try to make accommodations for patient's preferences in scheduling surgery, but sometimes the OR schedule or the surgeon's schedule prevents [place] from making those accommodations.  If you have not heard from our office [phone]) in 5 working days, call the office and ask for your surgeon's nurse.  If you have other questions about your diagnosis, plan, or surgery, call the office and ask for  your surgeon's nurse.  Pt Education - [facility] Rectal Prep for Anorectal outpatient/office surgery: discussed with patient and provided information. Pt Education - [facility] Rectal Surgery HCI (Natelie Ostrosky): discussed with patient and provided information.  Joanne Ballard, M.D., F.A.C.S. Gastrointestinal and Minimally Invasive Surgery [facility] [address] [place], [place] [address] [phone] Main / Paging

## 2016-08-20 NOTE — Discharge Instructions (Signed)
General Anesthesia, Adult, Care After These instructions provide you with information about caring for yourself after your procedure. Your health care provider may also give you more specific instructions. Your treatment has been planned according to current medical practices, but problems sometimes occur. Call your health care provider if you have any problems or questions after your procedure. What can I expect after the procedure? After the procedure, it is common to have:  Vomiting.  A sore throat.  Mental slowness. It is common to feel:  Nauseous.  Cold or shivery.  Sleepy.  Tired.  Sore or achy, even in parts of your body where you did not have surgery. Follow these instructions at home: For at least 24 hours after the procedure:  Do not:  Participate in activities where you could fall or become injured.  Drive.  Use heavy machinery.  Drink alcohol.  Take sleeping pills or medicines that cause drowsiness.  Make important decisions or sign legal documents.  Take care of children on your own.  Rest. Eating and drinking  If you vomit, drink water, juice, or soup when you can drink without vomiting.  Drink enough fluid to keep your urine clear or pale yellow.  Make sure you have little or no nausea before eating solid foods.  Follow the diet recommended by your health care provider. General instructions  Have a responsible adult stay with you until you are awake and alert.  Return to your normal activities as told by your health care provider. Ask your health care provider what activities are safe for you.  Take over-the-counter and prescription medicines only as told by your health care provider.  If you smoke, do not smoke without supervision.  Keep all follow-up visits as told by your health care provider. This is important. Contact a health care provider if:  You continue to have nausea or vomiting at home, and medicines are not helpful.  You  cannot drink fluids or start eating again.  You cannot urinate after 8-12 hours.  You develop a skin rash.  You have fever.  You have increasing redness at the site of your procedure. Get help right away if:  You have difficulty breathing.  You have chest pain.  You have unexpected bleeding.  You feel that you are having a life-threatening or urgent problem. This information is not intended to replace advice given to you by your health care provider. Make sure you discuss any questions you have with your health care provider. Document Released: 09/28/2000 Document Revised: 11/25/2015 Document Reviewed: 06/06/2015 Elsevier Interactive Patient Education  2017 [place]:  POST OPERATIVE INSTRUCTIONS  ######################################################################  EAT Gradually transition to a high fiber diet with a fiber supplement over the next few weeks after discharge.  Start with a pureed / full liquid diet (see below)  WALK Walk an hour a day.  Control your pain to do that.    CONTROL PAIN Control pain so that you can walk, sleep, tolerate sneezing/coughing, go up/down stairs.  HAVE A BOWEL MOVEMENT DAILY Keep your bowels regular to avoid problems.  OK to try a laxative to override constipation.  OK to use an antidairrheal to slow down diarrhea.  Call if not better after 2 tries  CALL IF YOU HAVE PROBLEMS/CONCERNS Call if you are still struggling despite following these instructions. Call if you have concerns not answered by these instructions  ######################################################################    1. Take your usually prescribed home medications unless otherwise directed. 2.  DIET: Follow a light bland diet the first 24 hours after arrival home, such as soup, liquids, crackers, etc.  Be sure to include lots of fluids daily.  Avoid fast food or heavy meals as your are more likely to get nauseated.  Eat a low fat  the next few days after surgery.   3. PAIN CONTROL: a. Pain is best controlled by a usual combination of three different methods TOGETHER: i. Ice/Heat ii. Over the counter pain medication iii. Prescription pain medication b. Most patients will experience some swelling and discomfort in the anus/rectal area. and incisions.  Ice packs or heat (30-60 minutes up to 6 times a day) will help. Use ice for the first few days to help decrease swelling and bruising, then switch to heat such as warm towels, sitz baths, warm baths, etc to help relax tight/sore spots and speed recovery.  Some people prefer to use ice alone, heat alone, alternating between ice & heat.  Experiment to what works for you.  Swelling and bruising can take several weeks to resolve.   c. It is helpful to take an over-the-counter pain medication regularly for the first few weeks.  Choose one of the following that works best for you: i. Naproxen (Aleve, etc)  Two 220mg  tabs twice a day ii. Ibuprofen (Advil, etc) Three 200mg  tabs four times a day (every meal & bedtime) iii. Acetaminophen (Tylenol, etc) 500-650mg  four times a day (every meal & bedtime) d. A  prescription for pain medication (such as oxycodone, hydrocodone, etc) should be given to you upon discharge.  Take your pain medication as prescribed.  i. If you are having problems/concerns with the prescription medicine (does not control pain, nausea, vomiting, rash, itching, etc), please call us [phone] to see if we need to switch you to a different pain medicine that will work better for you and/or control your side effect better. ii. If you need a refill on your pain medication, please contact your pharmacy.  They will contact our office to request authorization. Prescriptions will not be filled after 5 pm or on week-ends.  Use a Sitz Bath 4-8 times a day for relief   [employer] A sitz bath is a warm water bath taken in the sitting position that covers only the hips and  buttocks. It may be used for either healing or hygiene purposes. Sitz baths are also used to relieve pain, itching, or muscle spasms. The water may contain medicine. Moist heat will help you heal and relax.  HOME CARE INSTRUCTIONS  Take 3 to 4 sitz baths a day. 1. Fill the bathtub half full with warm water. 2. Sit in the water and open the drain a little. 3. Turn on the warm water to keep the tub half full. Keep the water running constantly. 4. Soak in the water for 15 to 20 minutes. 5. After the sitz bath, pat the affected area dry first.   4. KEEP YOUR BOWELS REGULAR a. The goal is one bowel movement a day b. Avoid getting constipated.  Between the surgery and the pain medications, it is common to experience some constipation.  Increasing fluid intake and taking a fiber supplement (such as Metamucil, Citrucel, FiberCon, MiraLax, etc) 1-2 times a day regularly will usually help prevent this problem from occurring.  A mild laxative (prune juice, Milk of Magnesia, MiraLax, etc) should be taken according to package directions if there are no bowel movements after 48 hours. c. Watch out for diarrhea.  If  you have many loose bowel movements, simplify your diet to bland foods & liquids for a few days.  Stop any stool softeners and decrease your fiber supplement.  Switching to mild anti-diarrheal medications (Kayopectate, Pepto Bismol) can help.  If this worsens or does not improve, please call us.  5. Wound Care  a. Remove your bandages the day after surgery.  Unless discharge instructions indicate otherwise, leave your bandage dry and in place overnight.  Remove the bandage during your first bowel movement.   b. Wear an absorbent pad or soft cotton gauze in your underwear as needed to catch any drainage and help keep the area  c. Keep the area clean and dry.  Bathe / shower every day.  Keep the area clean by showering / bathing over the incision / wound.   It is okay to soak an open wound to help wash  it.  Wet wipes or showers / gentle washing after bowel movements is often less traumatic than regular toilet paper. d. Dennis Bast will often notice bleeding with bowel movements.  This should slow down by the end of the first week of surgery e. Expect some drainage.  This should slow down, too, by the end of the first week of surgery.  Wear an absorbent pad or soft cotton gauze in your underwear until the drainage stops.  6. ACTIVITIES as tolerated:   a. You may resume regular (light) daily activities beginning the next day--such as daily self-care, walking, climbing stairs--gradually increasing activities as tolerated.  If you can walk 30 minutes without difficulty, it is safe to try more intense activity such as jogging, treadmill, bicycling, low-impact aerobics, swimming, etc. b. Save the most intensive and strenuous activity for last such as sit-ups, heavy lifting, contact sports, etc  Refrain from any heavy lifting or straining until you are off narcotics for pain control.   c. DO NOT PUSH THROUGH PAIN.  Let pain be your guide: If it hurts to do something, don't do it.  Pain is your body warning you to avoid that activity for another week until the pain goes down. d. You may drive when you are no longer taking prescription pain medication, you can comfortably sit for long periods of time, and you can safely maneuver your car and apply brakes. e. Dennis Bast may have sexual intercourse when it is comfortable.  7. FOLLOW UP in our office a. Please call [facility] at [phone] to set up an appointment to see your surgeon in the office for a follow-up appointment approximately 2 weeks after your surgery. b. Make sure that you call for this appointment the day you arrive home to insure a convenient appointment time. 10. IF YOU HAVE DISABILITY OR FAMILY LEAVE FORMS, BRING THEM TO THE OFFICE FOR PROCESSING.  DO NOT GIVE THEM TO YOUR DOCTOR.        WHEN TO CALL us [phone]: 1. Poor pain control 2. Reactions  / problems with new medications (rash/itching, nausea, etc)  3. Fever over 101.5 F (38.5 C) 4. Inability to urinate 5. Nausea and/or vomiting 6. Worsening swelling or bruising 7. Continued bleeding from incision. 8. Increased pain, redness, or drainage from the incision  The clinic staff is available to answer your questions during regular business hours (8:30am-5pm).  Please dont hesitate to call and ask to speak to one of our nurses for clinical concerns.   A surgeon from [facility] is always on call at the hospitals   If you have  a medical emergency, go to the nearest emergency room or call 911.    [facility], [address], [place], [place], [place]  [address] ? MAIN: [phone] ? TOLL FREE: [phone] ? FAX [phone] [email or web address]

## 2016-08-20 NOTE — Anesthesia Postprocedure Evaluation (Signed)
Anesthesia Post Note  Patient: Joanne Ballard  Procedure(s) Performed: Procedure(s) (LRB): PARTIAL PROCTECTOMY OF RECURRENT RECTAL POLYP BY TEM WITH ERAS PATHWAY (N/A) EXAM UNDER ANESTHESIA (N/A) HEMORRHOIDECTOMY  Patient location during evaluation: PACU Anesthesia Type: General Level of consciousness: awake and alert Pain management: pain level controlled Vital Signs Assessment: post-procedure vital signs reviewed and stable Respiratory status: spontaneous breathing, nonlabored ventilation and respiratory function stable Cardiovascular status: blood pressure returned to baseline and stable Postop Assessment: no signs of nausea or vomiting Anesthetic complications: no       Last Vitals:  Vitals:   08/20/16 1600 08/20/16 1614  BP: 115/71 113/68  Pulse: 72 72  Resp: 14 15  Temp: 36.7 C 36.7 C    Last Pain:  Vitals:   08/20/16 1600  TempSrc:   PainSc: 2                  Arya Boxley,W. EDMOND

## 2016-08-21 ENCOUNTER — Encounter (HOSPITAL_COMMUNITY): Payer: Self-pay | Admitting: Surgery

## 2019-07-03 ENCOUNTER — Ambulatory Visit: Payer: No Typology Code available for payment source | Attending: Internal Medicine

## 2019-07-03 DIAGNOSIS — Z20822 Contact with and (suspected) exposure to covid-19: Secondary | ICD-10-CM

## 2019-07-05 LAB — NOVEL CORONAVIRUS, NAA: SARS-CoV-2, NAA: NOT DETECTED

## 2021-09-22 ENCOUNTER — Other Ambulatory Visit: Payer: Self-pay | Admitting: Gastroenterology

## 2021-09-22 DIAGNOSIS — K7689 Other specified diseases of liver: Secondary | ICD-10-CM

## 2021-09-23 ENCOUNTER — Ambulatory Visit
Admission: RE | Admit: 2021-09-23 | Discharge: 2021-09-23 | Disposition: A | Discharge status: Home / Self Care | Payer: No Typology Code available for payment source | Source: Ambulatory Visit | Attending: Gastroenterology | Admitting: Gastroenterology

## 2021-09-23 DIAGNOSIS — K7689 Other specified diseases of liver: Secondary | ICD-10-CM

## 2021-10-02 ENCOUNTER — Other Ambulatory Visit: Payer: Self-pay | Admitting: Gastroenterology

## 2021-10-02 DIAGNOSIS — R932 Abnormal findings on diagnostic imaging of liver and biliary tract: Secondary | ICD-10-CM

## 2021-10-02 DIAGNOSIS — K769 Liver disease, unspecified: Secondary | ICD-10-CM

## 2021-10-03 ENCOUNTER — Other Ambulatory Visit: Payer: No Typology Code available for payment source

## 2021-10-14 ENCOUNTER — Inpatient Hospital Stay: Admission: RE | Admit: 2021-10-14 | Payer: Self-pay | Source: Ambulatory Visit

## 2021-10-17 ENCOUNTER — Other Ambulatory Visit: Payer: No Typology Code available for payment source

## 2021-10-20 ENCOUNTER — Inpatient Hospital Stay: Admission: RE | Admit: 2021-10-20 | Payer: No Typology Code available for payment source | Source: Ambulatory Visit

## 2021-10-29 ENCOUNTER — Other Ambulatory Visit: Payer: No Typology Code available for payment source

## 2021-10-29 ENCOUNTER — Inpatient Hospital Stay: Admission: RE | Admit: 2021-10-29 | Payer: 59 | Source: Ambulatory Visit

## 2021-10-30 ENCOUNTER — Ambulatory Visit
Admission: RE | Admit: 2021-10-30 | Discharge: 2021-10-30 | Disposition: A | Discharge status: Home / Self Care | Payer: 59 | Source: Ambulatory Visit | Attending: Gastroenterology | Admitting: Gastroenterology

## 2021-10-30 ENCOUNTER — Other Ambulatory Visit: Payer: No Typology Code available for payment source

## 2021-10-30 DIAGNOSIS — K769 Liver disease, unspecified: Secondary | ICD-10-CM

## 2021-10-30 DIAGNOSIS — R932 Abnormal findings on diagnostic imaging of liver and biliary tract: Secondary | ICD-10-CM

## 2022-06-23 ENCOUNTER — Other Ambulatory Visit: Payer: Self-pay | Admitting: Obstetrics and Gynecology

## 2022-06-23 DIAGNOSIS — N644 Mastodynia: Secondary | ICD-10-CM

## 2022-07-16 ENCOUNTER — Ambulatory Visit
Admission: RE | Admit: 2022-07-16 | Discharge: 2022-07-16 | Disposition: A | Discharge status: Home / Self Care | Payer: 59 | Source: Ambulatory Visit | Attending: Obstetrics and Gynecology | Admitting: Obstetrics and Gynecology

## 2022-07-16 DIAGNOSIS — N644 Mastodynia: Secondary | ICD-10-CM

## 2023-04-04 IMAGING — US US ABDOMEN LIMITED
1 series · 14 of 25 positions shown · non-contrast
Comparison: None. Reported MR comparison is not available for
review

CLINICAL DATA: Liver cyst, reportedly seen on comparison MRI.

EXAM:
ULTRASOUND ABDOMEN LIMITED RIGHT UPPER QUADRANT

[Series 1: us abdomen limited · 0.19mm/px · 14 of 59 slices shown]
[im 1/59]
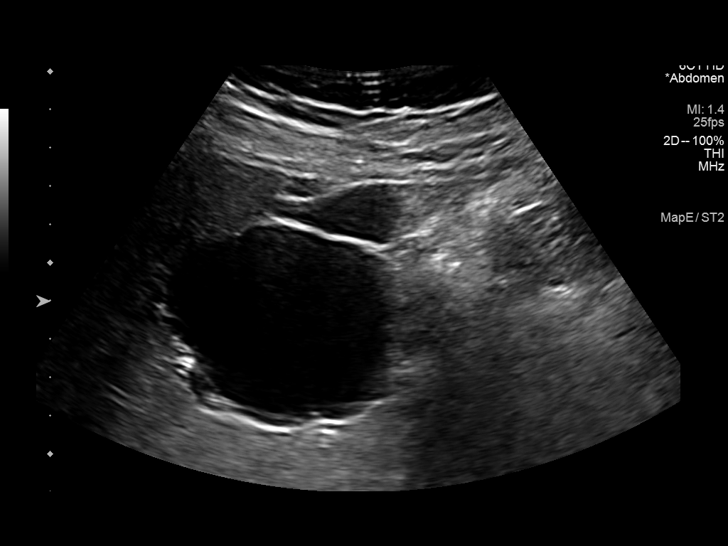
[im 5/59]
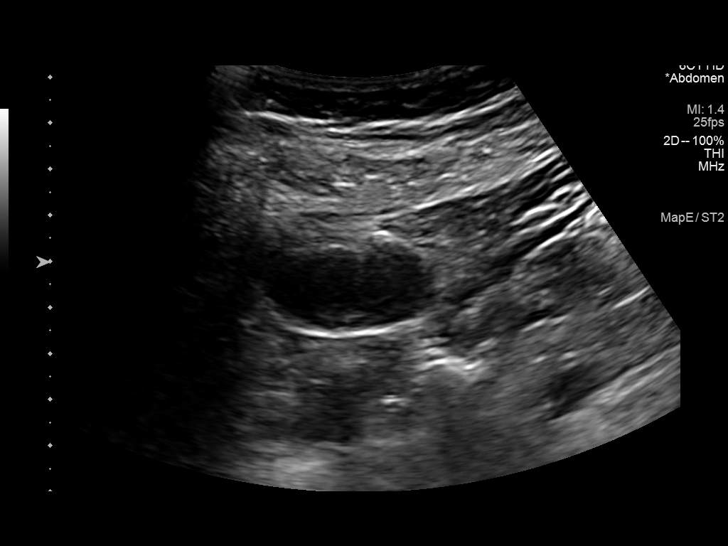
[im 10/59]
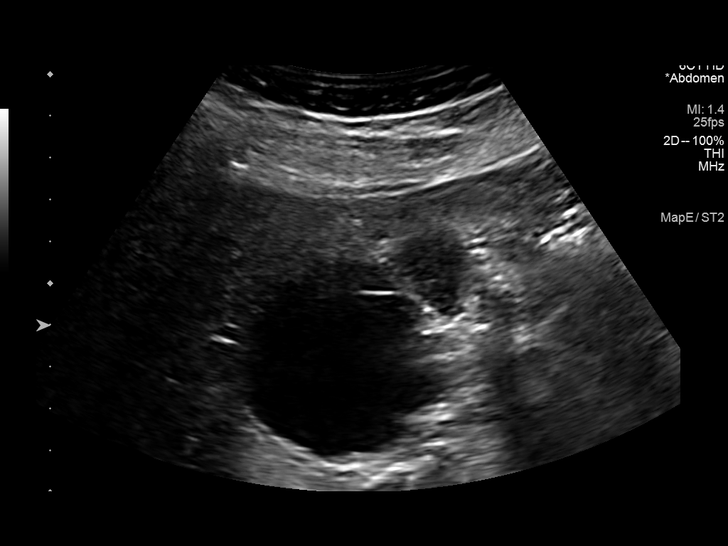
[im 15/59]
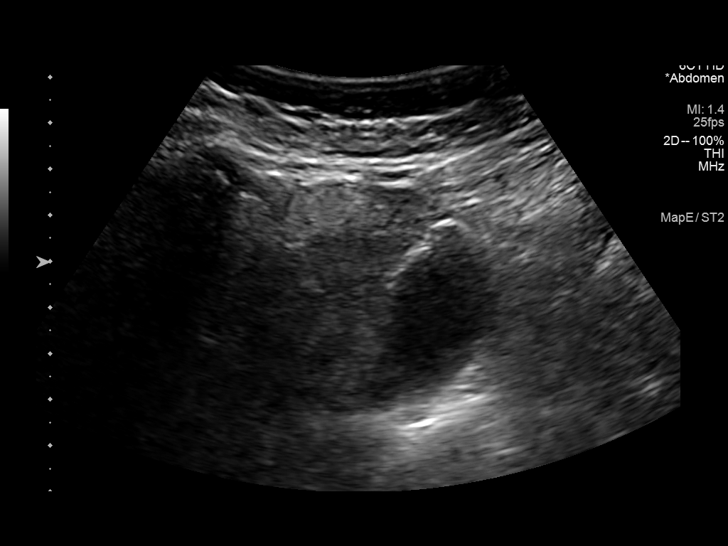
[im 20/59]
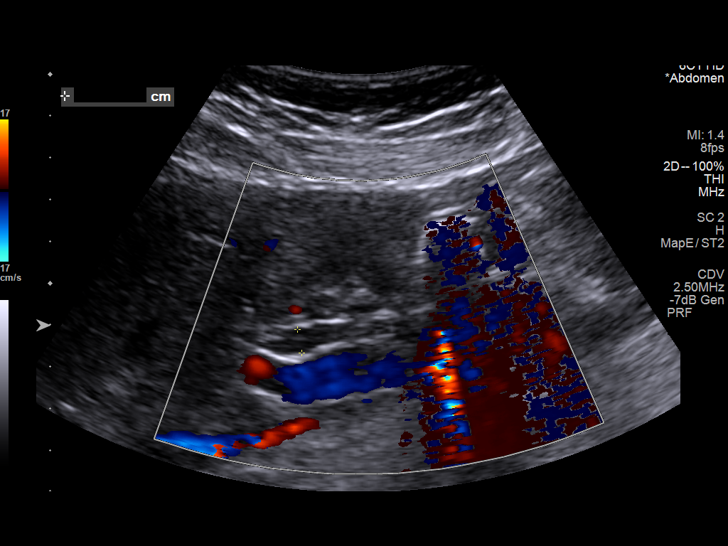
[im 22/59]
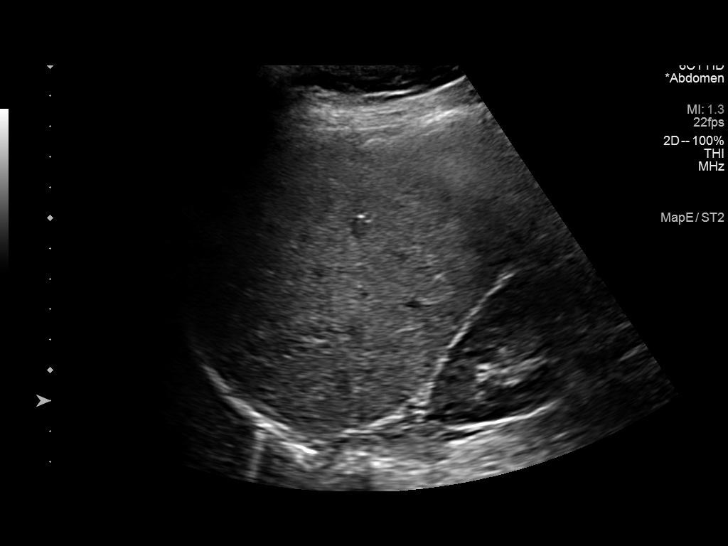
[im 27/59]
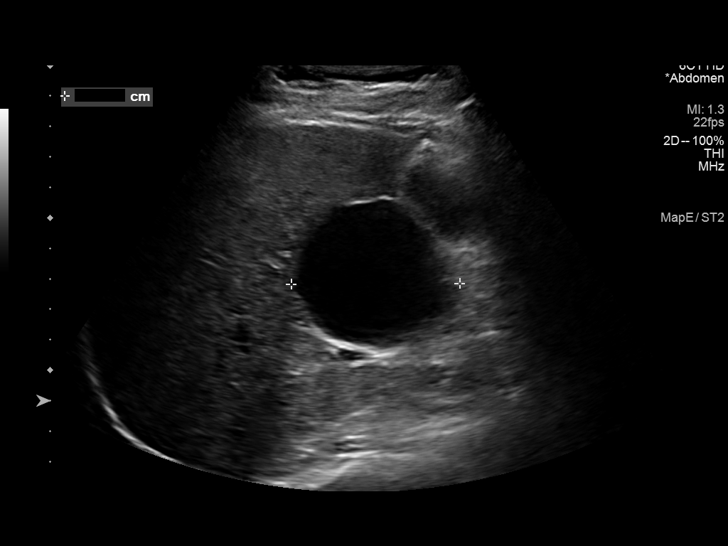
[im 32/59]
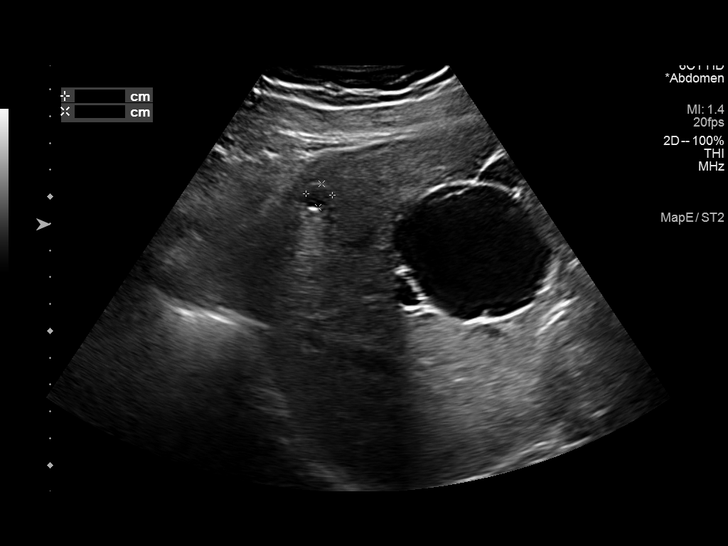
[im 37/59]
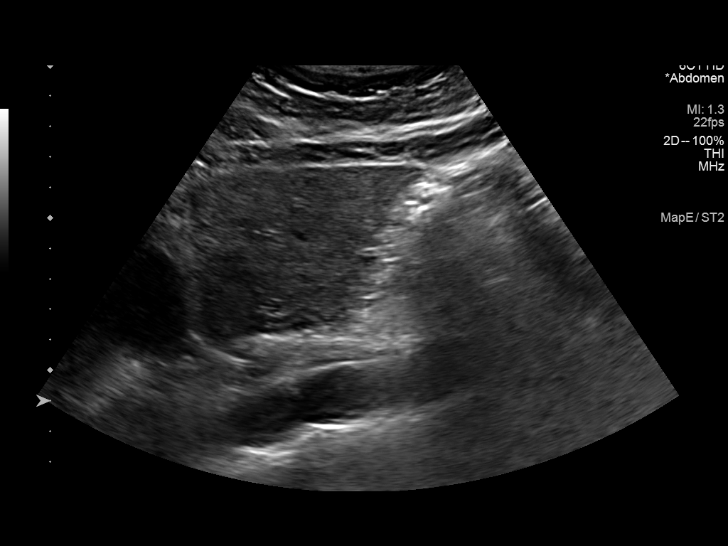
[im 39/59]
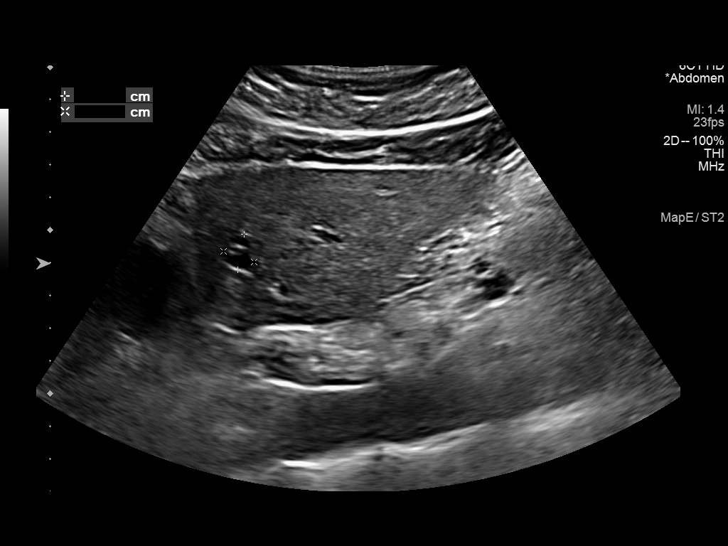
[im 44/59]
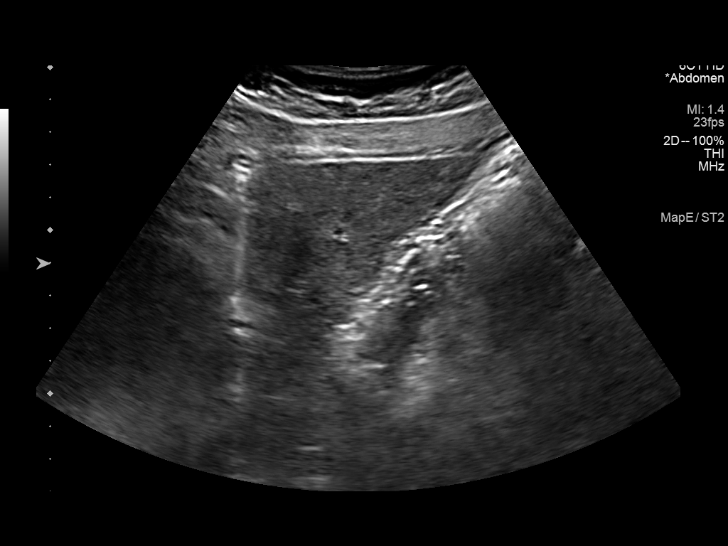
[im 49/59]
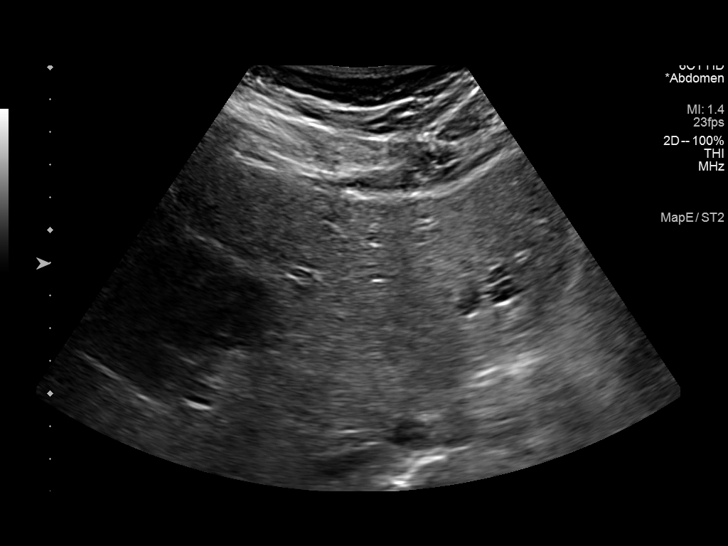
[im 54/59]
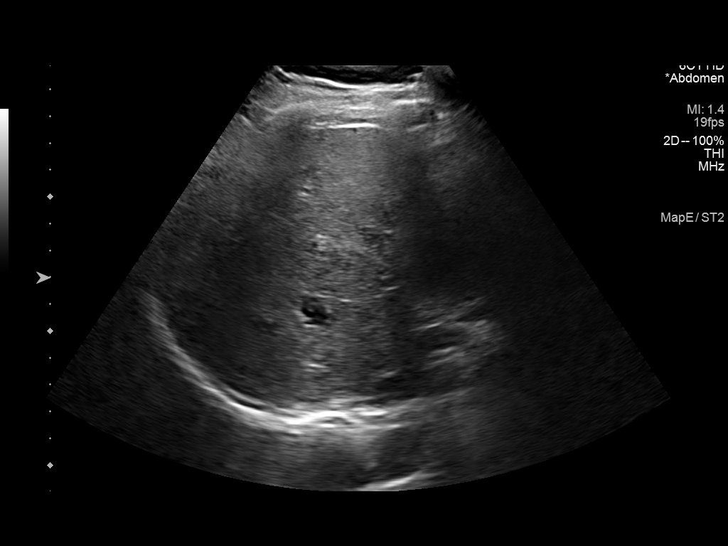
[im 59/59]
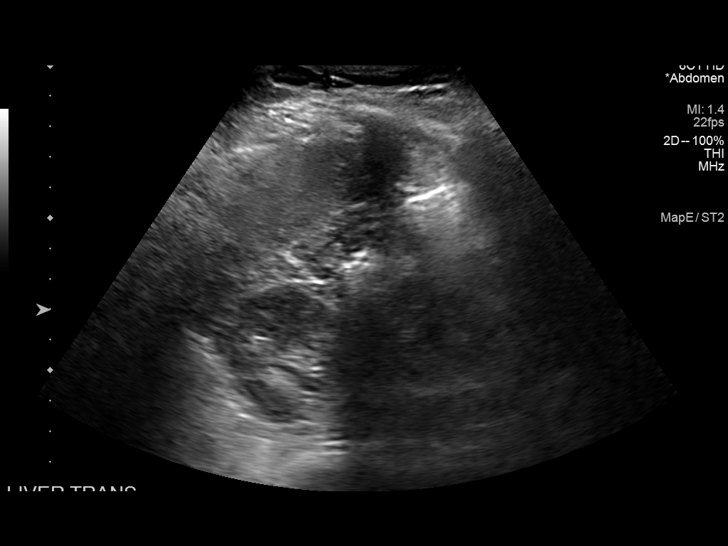

[14 of 25 positions shown; findings below may reference images not displayed]

FINDINGS: Suboptimal evaluation, with poor acoustic penetration secondary to
patient habitus.

Gallbladder:

No gallstones or wall thickening visualized. No sonographic Murphy
sign noted by sonographer.

Common bile duct:

Diameter: 0.6 cm

Liver:

Increased hepatic parenchymal echogenicity. Portal vein is patent on
color Doppler imaging with normal direction of blood flow towards
the liver.

Rounded, well-circumscribed and anechoic hepatic masses largest
within the RIGHT hepatic lobe and measuring up to 5.8 x 5.3 x
cm.

Additional lesions are smaller, measuring up to 1.1 cm at both the
RIGHT and LEFT hepatic lobes.
IMPRESSION: 1. Rounded anechoic liver lesions, largest within the RIGHT hepatic
lobe and measuring up to 5.8 cm are consistent with simple-appearing
hepatic cysts.
2. Echogenic liver. Findings most commonly seen in hepatic
steatosis, though may also represent hepatitis and/or fibrosis.

## 2023-05-11 IMAGING — US US ABDOMEN LIMITED W/ ELASTOGRAPHY
1 series · 12 of 12 positions shown · non-contrast
Comparison: Ultrasound September 23, 2021

CLINICAL DATA: Follow-up liver cysts. History of hepatic steatosis.

EXAM:
US ABDOMEN LIMITED - RIGHT UPPER QUADRANT
ULTRASOUND HEPATIC ELASTOGRAPHY
TECHNIQUE: Sonography of the right upper quadrant was performed. In addition,
ultrasound elastography evaluation of the liver was performed. A
region of interest was placed within the right lobe of the liver.
Following application of a compressive sonographic pulse, tissue
compressibility was assessed. Multiple assessments were performed at
the selected site. Median tissue compressibility was determined.
Previously, hepatic stiffness was assessed by shear wave velocity.
Based on recently published Society of Radiologists in Ultrasound
consensus article, reporting is now recommended to be performed in
the SI units of pressure (kiloPascals) representing hepatic
stiffness/elasticity. The obtained result is compared to the
published reference standards. (cACLD = compensated Advanced Chronic
Liver Disease)

[Series 1: us abdomen limited w/ elastography · 0.14mm/px · 12 of 12 slices shown]
[im 1/12]
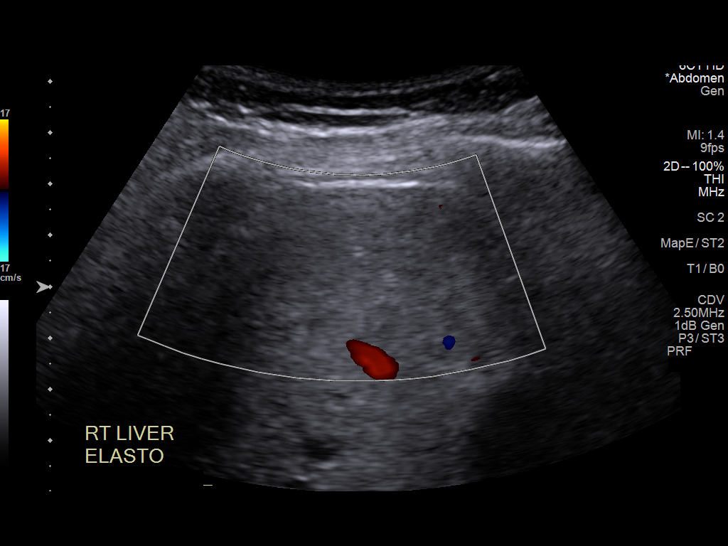
[im 2/12]
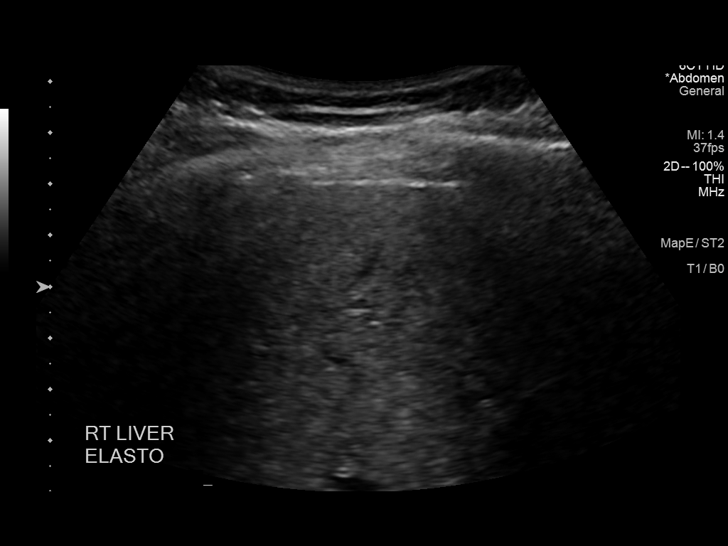
[im 3/12]
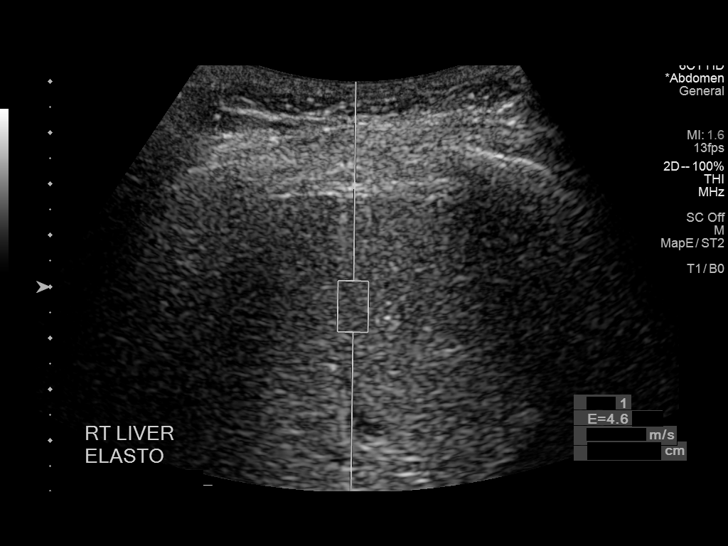
[im 4/12]
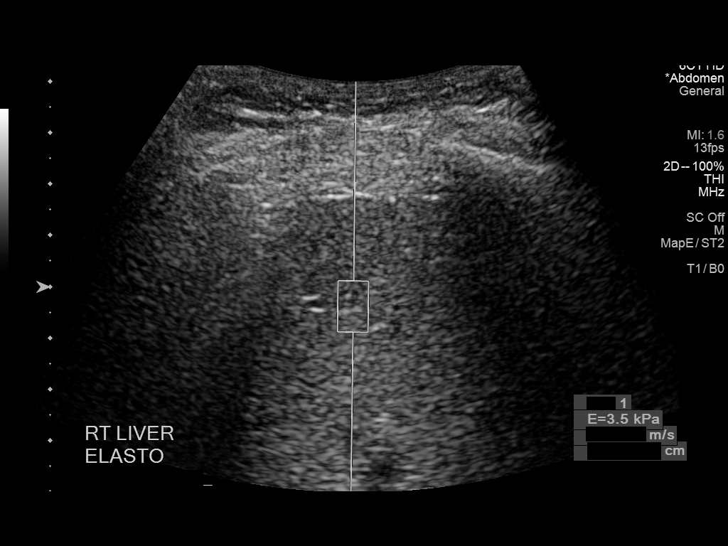
[im 5/12]
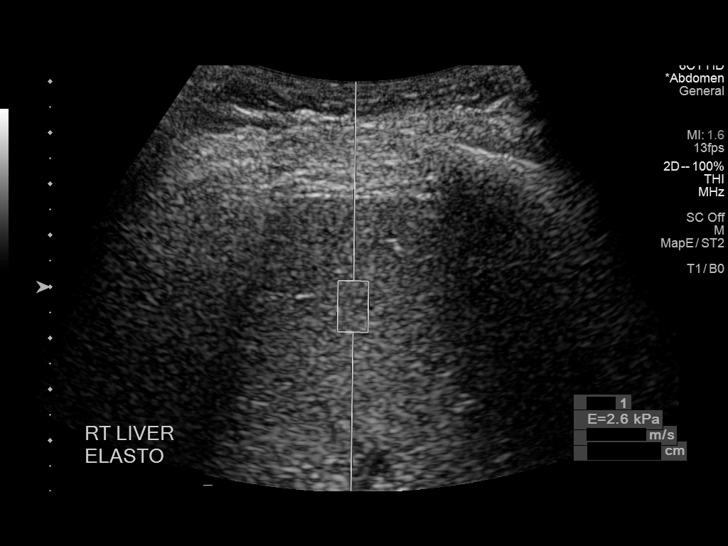
[im 6/12]
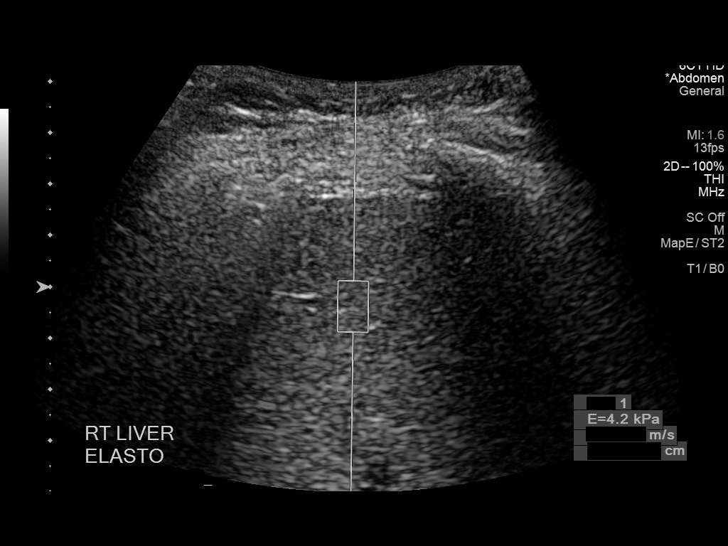
[im 7/12]
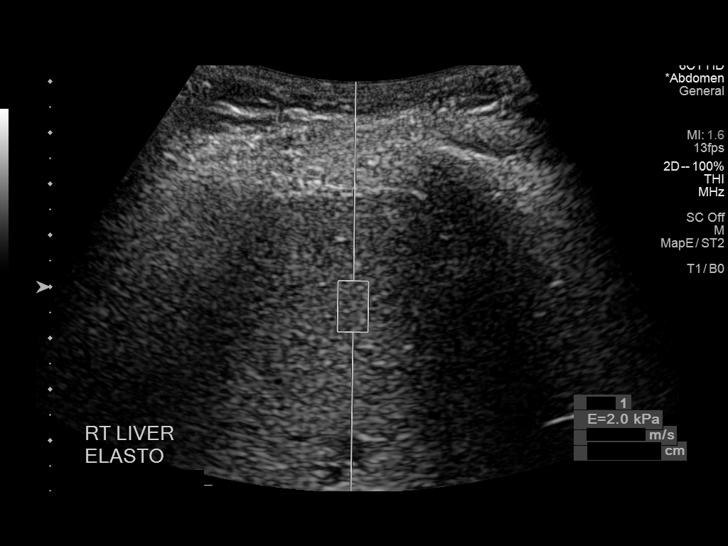
[im 8/12]
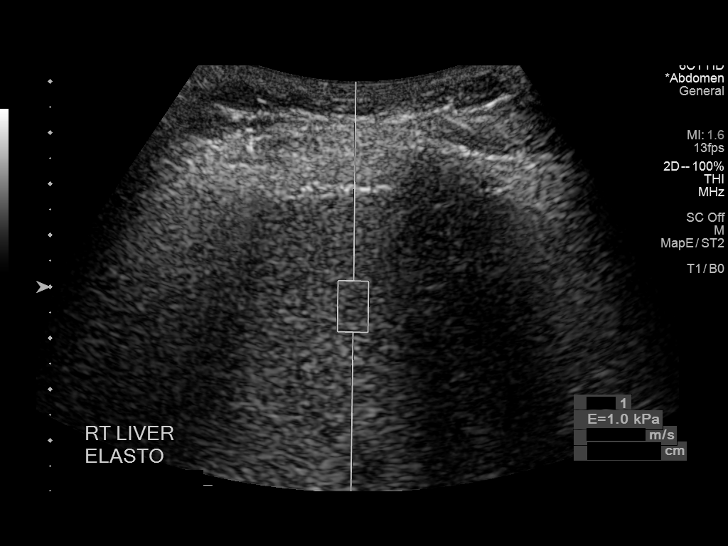
[im 9/12]
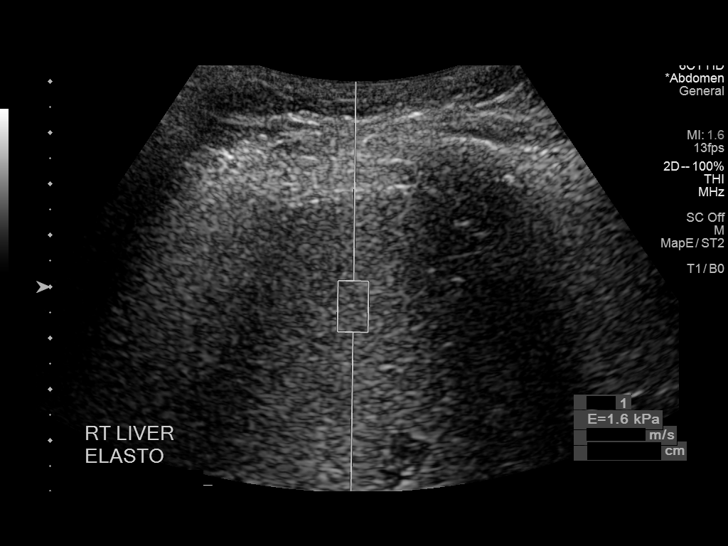
[im 10/12]
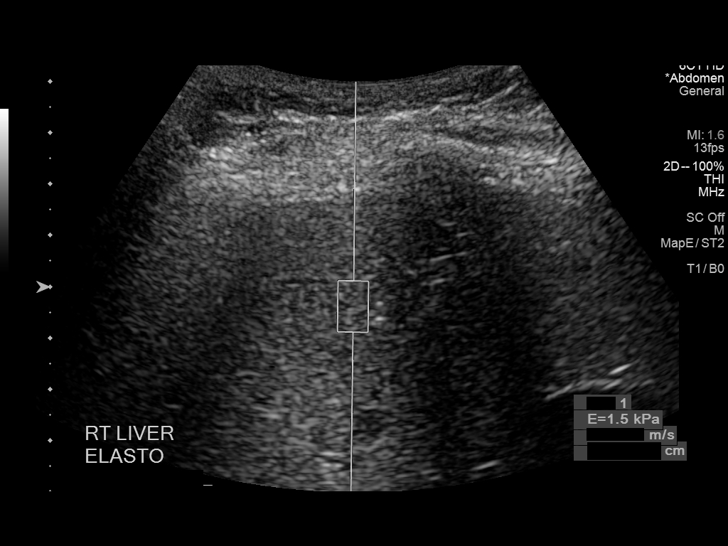
[im 11/12]
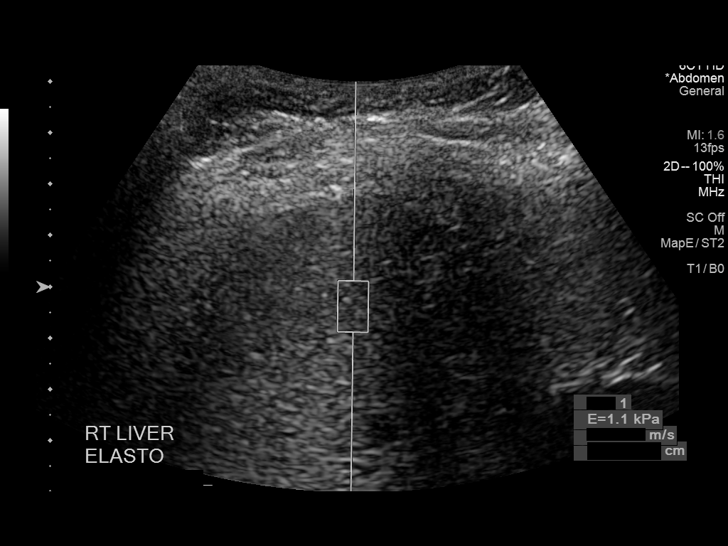
[im 12/12]
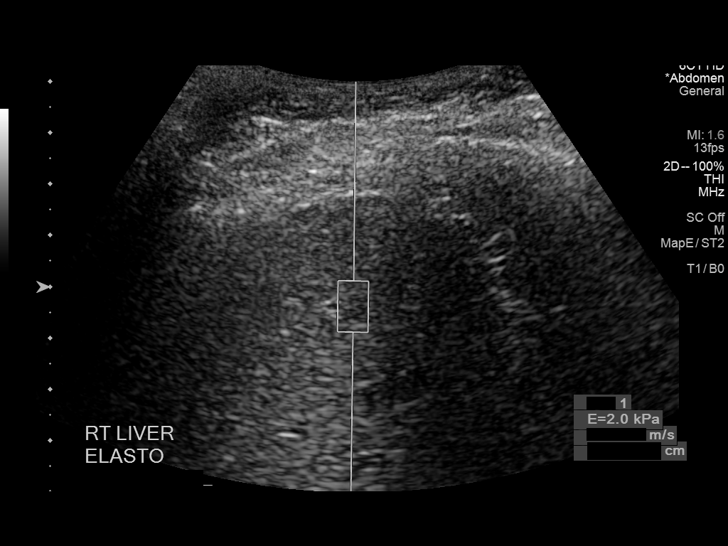

[12 of 12 positions shown; findings below may reference images not displayed]

FINDINGS: ULTRASOUND ABDOMEN LIMITED RIGHT UPPER QUADRANT

Gallbladder:

Adenomyomatosis of the gallbladder wall with a 5 mm gallbladder
polyp. No gallstones or wall thickening visualized. No sonographic
Murphy sign noted.

Common bile duct:

Diameter: 3 mm

Liver:

Coarsened hepatic echotexture with heterogeneously increased
parenchymal echogenicity. Simple appearing cysts in the right lobe
of the liver measure up to 5.8 cm. Septated cyst versus 2 adjacent
cysts in the left lobe liver measuring 2.1 cm. Portal vein is patent
on color Doppler imaging with normal direction of blood flow towards
the liver.

ULTRASOUND HEPATIC ELASTOGRAPHY

Device: Siemens Helix VTQ

Patient position: Oblique

Transducer [id number]

Number of measurements: 10

Hepatic segment:  8

Median kPa:

IQR:

IQR/Median kPa ratio: 1

Data quality: IQR/Median kPa ratio of 0.3 or greater indicates
reduced accuracy

Diagnostic category:  < or = 5 kPa: high probability of being normal

The use of hepatic elastography is applicable to patients with viral
hepatitis and non-alcoholic fatty liver disease. At this time, there
is insufficient data for the referenced cut-off values and use in
other causes of liver disease, including alcoholic liver disease.
Patients, however, may be assessed by elastography and serve as
their own reference standard/baseline.

In patients with non-alcoholic liver disease, the values suggesting
compensated advanced chronic liver disease (cACLD) may be lower, and
patients may need additional testing with elasticity results of [DATE]
kPa.

Please note that abnormal hepatic elasticity and shear wave
velocities may also be identified in clinical settings other than
with hepatic fibrosis, such as: acute hepatitis, elevated right
heart and central venous pressures including use of beta blockers,
Dayanaa disease (Grom), infiltrative processes such as
mastocytosis/amyloidosis/infiltrative tumor/lymphoma, extrahepatic
cholestasis, with hyperemia in the post-prandial state, and with
liver transplantation. Correlation with patient history, laboratory
data, and clinical condition recommended.

Diagnostic Categories:

< or =5 kPa: high probability of being normal

< or =9 kPa: in the absence of other known clinical signs, rules [DATE] kPa and ?13 kPa: suggestive of cACLD, but needs further testing

>13 kPa: highly suggestive of cACLD

> or =17 kPa: highly suggestive of cACLD with an increased
probability of clinically significant portal hypertension
IMPRESSION: ULTRASOUND RUQ:

Coarsened hepatic echotexture with increased parenchymal
echogenicity most commonly reflecting hepatic steatosis or
hepatocellular disease.

Similar bilobar hepatic cysts measure up to 5.8 cm.

ULTRASOUND HEPATIC ELASTOGRAPHY:

Median kPa:

Diagnostic category:  < or = 5 kPa: high probability of being normal

## 2023-11-17 ENCOUNTER — Other Ambulatory Visit (HOSPITAL_BASED_OUTPATIENT_CLINIC_OR_DEPARTMENT_OTHER): Payer: Self-pay | Admitting: Obstetrics and Gynecology

## 2023-11-17 DIAGNOSIS — Z8249 Family history of ischemic heart disease and other diseases of the circulatory system: Secondary | ICD-10-CM

## 2023-11-22 ENCOUNTER — Ambulatory Visit (HOSPITAL_BASED_OUTPATIENT_CLINIC_OR_DEPARTMENT_OTHER)
Admission: RE | Admit: 2023-11-22 | Discharge: 2023-11-22 | Disposition: A | Discharge status: Home / Self Care | Payer: Self-pay | Source: Ambulatory Visit | Attending: Obstetrics and Gynecology | Admitting: Obstetrics and Gynecology

## 2023-11-22 DIAGNOSIS — Z8249 Family history of ischemic heart disease and other diseases of the circulatory system: Secondary | ICD-10-CM | POA: Insufficient documentation

## 2024-03-23 ENCOUNTER — Telehealth: Payer: Self-pay

## 2024-03-23 NOTE — Telephone Encounter (Signed)
 Left message for patient to call back

## 2024-03-23 NOTE — Progress Notes (Signed)
 CARDIOLOGY CONSULT NOTE       Patient ID: Joanne Ballard MRN: [id number] DOB/AGE: [birth year] 58 y.o.  Referring Physician: Leva Primary Physician: Leva Rush, MD Primary Cardiologist: New Reason for Consultation: CAD    HPI:  58 y.o. referred by Dr Leva for CAD. Patient had high calcium score for age. Reviewed scan from 11/23/23 and calcium noted in LAD/RCA with total score 23.7 which is 87 th percentile for age/sex  She is a non smoker, non diabetic, no HTN   A1c is 5.1 LDL 111 HDL 86  Father was heavy smoker and had CABG in 60's.   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 Has younger sister in [place]. Has two older children one in [place] married and one at home now after graduating from [employer]. 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  Social History   Socioeconomic History   Marital status: Married    Spouse name: Not on file   Number of children: Not on file   Years of education: Not on file   Highest education level: Not on file  Occupational History   Not on file  Tobacco Use   Smoking status: Never   Smokeless tobacco: Never  Substance and Sexual Activity   Alcohol use: Yes    Comment: social   Drug use: No   Sexual activity: Not on file  Other Topics Concern   Not on file  Social History Narrative   Not on file   Social Drivers of Health   Financial Resource Strain: Not on file  Food Insecurity: Not on file  Transportation Needs: Not on file  Physical Activity: Not on file  Stress: Not on file  Social Connections:  Unknown (11/18/2021)   Received from [facility]   Social Network    Social Network: Not on file  Intimate Partner Violence: Unknown (10/10/2021)   Received from [facility]   HITS    Physically Hurt: Not on file    Insult or Talk Down To: Not on file    Threaten Physical Harm: Not on file    Scream or Curse: Not on file    Past Surgical History:  Procedure Laterality Date   COLONOSCOPY WITH PROPOFOL  N/A 11/13/2015   Procedure: COLONOSCOPY WITH PROPOFOL ;  Surgeon: Elsie Cree, MD;  Location: [facility];  Service: Endoscopy;  Laterality: N/A;   colonscopy  jan 2018 and nov 2016   endometrial ablation     HEMORRHOID SURGERY  08/20/2016   Procedure: HEMORRHOIDECTOMY;  Surgeon: Elspeth Schultze, MD;  Location: [facility];  Service: General;;   PARTIAL PROCTECTOMY BY TEM N/A 08/20/2016   Procedure: PARTIAL PROCTECTOMY OF RECURRENT RECTAL POLYP BY TEM WITH ERAS PATHWAY;  Surgeon: Elspeth Schultze, MD;  Location: [facility];  Service: General;  Laterality: N/A;      Current Outpatient Medications:    Cholecalciferol 50 MCG (2000 UT) TABS, 1 tablet Orally Once a day; Duration: 30 day(s), Disp: , Rfl:    estradiol (VIVELLE-DOT) 0.075 MG/24HR, APPLY 1 PATCH TOPICALLY TO THE SKIN 2 TIMES A WEEK,  Disp: , Rfl:    Fiber Adult Gummies 2 g CHEW, as directed Orally, Disp: , Rfl:    progesterone (PROMETRIUM) 100 MG capsule, Take 100 mg by mouth at bedtime., Disp: , Rfl:    ibuprofen (ADVIL,MOTRIN) 200 MG tablet, Take 600 mg by mouth every 6 (six) hours as needed for headache or mild pain. , Disp: , Rfl:    oxyCODONE  (OXY IR/ROXICODONE ) 5 MG immediate release tablet, Take 1-2 tablets (5-10 mg total) by mouth every 4 (four) hours as needed for moderate pain, severe pain or breakthrough pain., Disp: 40 tablet, Rfl: 0    Physical Exam: Blood pressure 124/80, height 5' 8 (1.727 m), weight 171 lb (77.6 kg).    Affect appropriate Healthy:  appears stated age HEENT: normal Neck supple with no adenopathy JVP  normal no bruits no thyromegaly Lungs clear with no wheezing and good diaphragmatic motion Heart:  S1/S2 no murmur, no rub, gallop or click PMI normal Abdomen: benighn, BS positve, no tenderness, no AAA no bruit.  No HSM or HJR Distal pulses intact with no bruits No edema Neuro non-focal Skin warm and dry No muscular weakness   Labs:   Lab Results  Component Value Date   WBC 4.7 08/11/2016   HGB 12.6 08/11/2016   HCT 37.4 08/11/2016   MCV 86.8 08/11/2016   PLT 248 08/11/2016     Radiology: No results found.  EKG: SR rate 58 LAFB    ASSESSMENT AND PLAN:   CAD:  high calcium score for age. Discussed further risk stratifying with LpA.  Start low dose crestor 3x/week as compromise to minimize side effects over the years and get LDL< 100  No need for stress testing  Primary Care :  encouraged her to get primary and not use her OB as such Will send referral to [facility]  Lipid/Live/LpA in 3 months  F/U in a year   Signed: Maude Emmer 04/05/2024, 9:54 AM

## 2024-03-23 NOTE — Telephone Encounter (Signed)
-----   Message from Maude Emmer sent at 03/23/2024  2:37 PM EDT ----- New patient 04/05/24 if she does not have recent lipid and liver labs need to get them before visit

## 2024-04-05 ENCOUNTER — Encounter: Payer: Self-pay | Admitting: Cardiovascular Disease

## 2024-04-05 ENCOUNTER — Ambulatory Visit: Attending: Internal Medicine | Admitting: Cardiovascular Disease

## 2024-04-05 VITALS — BP 124/80 | Ht 68.0 in | Wt 171.0 lb

## 2024-04-05 DIAGNOSIS — R931 Abnormal findings on diagnostic imaging of heart and coronary circulation: Secondary | ICD-10-CM

## 2024-04-05 DIAGNOSIS — I2510 Atherosclerotic heart disease of native coronary artery without angina pectoris: Secondary | ICD-10-CM | POA: Diagnosis not present

## 2024-04-05 MED ORDER — ROSUVASTATIN CALCIUM 5 MG PO TABS: 5.0000 mg | ORAL_TABLET | ORAL | 3 refills | Status: AC

## 2024-04-05 NOTE — Patient Instructions (Signed)
 Medication Instructions:  Your physician has recommended you make the following change in your medication:   1) START rosuvastatin (Crestor) 5 mg every Monday, Wednesday, Friday at night  *If you need a refill on your cardiac medications before your next appointment, please call your pharmacy*  Lab Work: In 3 months: fasting labs (Lipid panel, LFTs, LP(a) If you have labs (blood work) drawn today and your tests are completely normal, you will receive your results only by: MyChart Message (if you have MyChart) OR A paper copy in the mail If you have any lab test that is abnormal or we need to change your treatment, we will call you to review the results.  Follow-Up: At [facility], you and your health needs are our priority.  As part of our continuing mission to provide you with exceptional heart care, our providers are all part of one team.  This team includes your primary Cardiologist (physician) and Advanced Practice Providers or APPs (Physician Assistants and Nurse Practitioners) who all work together to provide you with the care you need, when you need it.  Your next appointment:   1 year(s)  Provider:   Maude Emmer, MD   We recommend signing up for the patient portal called MyChart.  Sign up information is provided on this After Visit Summary.  MyChart is used to connect with patients for Virtual Visits (Telemedicine).  Patients are able to view lab/test results, encounter notes, upcoming appointments, etc.  Non-urgent messages can be sent to your provider as well.    To learn more about what you can do with MyChart, go to [email or web address].

## 2024-07-04 ENCOUNTER — Ambulatory Visit: Payer: Self-pay

## 2024-07-04 DIAGNOSIS — E785 Hyperlipidemia, unspecified: Secondary | ICD-10-CM
